# Patient Record
Sex: Female | Born: 1975 | Race: Black or African American | Hispanic: No | Marital: Married | Smoking: Former smoker
Health system: Southern US, Community
[De-identification: ages and names within clinical notes are randomized; demographics above are authoritative.]

## PROBLEM LIST (undated history)

## (undated) DIAGNOSIS — F32A Depression, unspecified: Secondary | ICD-10-CM

## (undated) DIAGNOSIS — F329 Major depressive disorder, single episode, unspecified: Secondary | ICD-10-CM

## (undated) DIAGNOSIS — D649 Anemia, unspecified: Secondary | ICD-10-CM

## (undated) DIAGNOSIS — Z789 Other specified health status: Secondary | ICD-10-CM

## (undated) DIAGNOSIS — Z8632 Personal history of gestational diabetes: Secondary | ICD-10-CM

## (undated) DIAGNOSIS — D219 Benign neoplasm of connective and other soft tissue, unspecified: Secondary | ICD-10-CM

## (undated) DIAGNOSIS — E119 Type 2 diabetes mellitus without complications: Secondary | ICD-10-CM

## (undated) HISTORY — DX: Benign neoplasm of connective and other soft tissue, unspecified: D21.9

## (undated) HISTORY — DX: Major depressive disorder, single episode, unspecified: F32.9

## (undated) HISTORY — DX: Type 2 diabetes mellitus without complications: E11.9

## (undated) HISTORY — DX: Anemia, unspecified: D64.9

## (undated) HISTORY — PX: WISDOM TOOTH EXTRACTION: SHX21

## (undated) HISTORY — DX: Personal history of gestational diabetes: Z86.32

## (undated) HISTORY — DX: Depression, unspecified: F32.A

---
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
## 1999-08-09 ENCOUNTER — Emergency Department (HOSPITAL_COMMUNITY): Admission: EM | Admit: 1999-08-09 | Discharge: 1999-08-09 | Payer: Self-pay | Admitting: Emergency Medicine

## 1999-09-08 ENCOUNTER — Other Ambulatory Visit: Admission: RE | Admit: 1999-09-08 | Discharge: 1999-09-08 | Payer: Self-pay | Admitting: *Deleted

## 2001-08-02 ENCOUNTER — Emergency Department (HOSPITAL_COMMUNITY): Admission: EM | Admit: 2001-08-02 | Discharge: 2001-08-02 | Payer: Self-pay | Admitting: Emergency Medicine

## 2005-10-05 ENCOUNTER — Emergency Department (HOSPITAL_COMMUNITY): Admission: EM | Admit: 2005-10-05 | Discharge: 2005-10-06 | Payer: Self-pay | Admitting: Emergency Medicine

## 2006-01-07 ENCOUNTER — Inpatient Hospital Stay (HOSPITAL_COMMUNITY): Admission: AD | Admit: 2006-01-07 | Discharge: 2006-01-14 | Payer: Self-pay | Admitting: *Deleted

## 2006-01-07 ENCOUNTER — Ambulatory Visit: Payer: Self-pay | Admitting: Family Medicine

## 2006-01-15 ENCOUNTER — Inpatient Hospital Stay (HOSPITAL_COMMUNITY): Admission: AD | Admit: 2006-01-15 | Discharge: 2006-01-15 | Payer: Self-pay | Admitting: Obstetrics & Gynecology

## 2006-01-15 ENCOUNTER — Ambulatory Visit: Payer: Self-pay | Admitting: Certified Nurse Midwife

## 2006-01-18 ENCOUNTER — Ambulatory Visit: Payer: Self-pay | Admitting: Gynecology

## 2006-01-23 ENCOUNTER — Encounter: Admission: RE | Admit: 2006-01-23 | Discharge: 2006-01-23 | Payer: Self-pay | Admitting: Gynecology

## 2006-01-28 ENCOUNTER — Inpatient Hospital Stay (HOSPITAL_COMMUNITY): Admission: AD | Admit: 2006-01-28 | Discharge: 2006-03-24 | Payer: Self-pay | Admitting: Obstetrics and Gynecology

## 2006-01-28 ENCOUNTER — Ambulatory Visit: Payer: Self-pay | Admitting: Family Medicine

## 2006-03-21 ENCOUNTER — Encounter (INDEPENDENT_AMBULATORY_CARE_PROVIDER_SITE_OTHER): Payer: Self-pay | Admitting: Specialist

## 2006-03-25 ENCOUNTER — Encounter: Admission: RE | Admit: 2006-03-25 | Discharge: 2006-04-04 | Payer: Self-pay | Admitting: Obstetrics and Gynecology

## 2006-04-03 ENCOUNTER — Inpatient Hospital Stay (HOSPITAL_COMMUNITY): Admission: AD | Admit: 2006-04-03 | Discharge: 2006-04-03 | Payer: Self-pay | Admitting: Obstetrics and Gynecology

## 2006-05-02 ENCOUNTER — Ambulatory Visit: Payer: Self-pay | Admitting: *Deleted

## 2009-04-13 ENCOUNTER — Ambulatory Visit: Payer: Self-pay | Admitting: Obstetrics and Gynecology

## 2009-04-13 ENCOUNTER — Encounter: Payer: Self-pay | Admitting: Obstetrics and Gynecology

## 2009-04-13 ENCOUNTER — Other Ambulatory Visit: Admission: RE | Admit: 2009-04-13 | Discharge: 2009-04-13 | Payer: Self-pay | Admitting: Obstetrics and Gynecology

## 2009-04-20 ENCOUNTER — Ambulatory Visit: Payer: Self-pay | Admitting: Gynecology

## 2009-04-20 ENCOUNTER — Encounter: Admission: RE | Admit: 2009-04-20 | Discharge: 2009-04-20 | Payer: Self-pay | Admitting: Obstetrics and Gynecology

## 2010-05-12 ENCOUNTER — Ambulatory Visit: Payer: Self-pay | Admitting: Obstetrics and Gynecology

## 2010-05-13 ENCOUNTER — Ambulatory Visit: Payer: Self-pay | Admitting: Obstetrics and Gynecology

## 2010-05-31 ENCOUNTER — Other Ambulatory Visit: Admission: RE | Admit: 2010-05-31 | Discharge: 2010-05-31 | Payer: Self-pay | Admitting: Obstetrics and Gynecology

## 2010-05-31 ENCOUNTER — Ambulatory Visit: Payer: Self-pay | Admitting: Obstetrics and Gynecology

## 2010-06-23 ENCOUNTER — Ambulatory Visit: Payer: Self-pay | Admitting: Obstetrics and Gynecology

## 2010-09-18 ENCOUNTER — Encounter: Payer: Self-pay | Admitting: *Deleted

## 2011-01-13 NOTE — Discharge Summary (Signed)
Katelyn [name], Katelyn [name]               ACCOUNT NO.:  [id number]   MEDICAL RECORD NO.:  [id number]          PATIENT TYPE:  INP   LOCATION:  WOC                           [facility]   PHYSICIAN:  Tracy L. Mayford Knife, M.D.DATE OF BIRTH:  05-19-1976   DATE OF ADMISSION:  DATE OF DISCHARGE:                                 DISCHARGE SUMMARY   ADDENDUM:  Please note the patient had a primary lower transverse cesarean  section and a myomectomy.  The patient was counseled because she had a  myomectomy that she should not have a vaginal birth.  She should always have  a repeat cesarean section.           ______________________________  Marc Morgans Mayford Knife, M.D.     TLW/MEDQ  D:  03/24/2006  T:  03/24/2006  Job:  [id number]

## 2011-01-13 NOTE — Consult Note (Signed)
[name], [name], M.D.  DATE OF BIRTH:  October 31, 1975   DATE OF CONSULTATION:  03/15/2006  DATE OF DISCHARGE:                                   CONSULTATION   REASON FOR CONSULTATION:  Depression.   HISTORY OF PRESENT ILLNESS:  Katelyn [name] is a 35 year old female  admitted on the January 28, 2006 for return of bleeding previa.   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  She has continued  with depressed mood, decreased energy, uncharacteristic crying spells,  difficulty concentrating for several days.  There have been no thoughts of  harming herself, no thoughts of harming others, no hallucinations, no  delusions.  She has declined antidepressant medication and has been engaging  in some homeopathic techniques.   On the date of consultation, her energy is still decreased.  Her mood is  improved with interests in the future and hope has returned.  She has no  thoughts of harming herself, no thoughts of harming others, no delusions, no  hallucinations.  She is able to discuss interest in a number of subjects.  The patient has utilized Ambien for insomnia with good results.   PAST PSYCHIATRIC HISTORY:  No history of major depression.  No history of  mania, no history of delusions or hallucinations.  No history of psychiatric  treatment, psychotropics or counseling.   FAMILY PSYCHIATRIC HISTORY:  None.   SOCIAL HISTORY:  Religion is [place].  Marital status is divorced and  currently engaged to be married.  This is her first pregnancy.  The father  of the baby is motivated and supportive.  He is dedicated to the future of  marriage with this patient.   The patient lives with her fiance.  Illegal drugs,  none.  Alcohol, none.  Education, the patient is working on her PhD.   GENERAL MEDICAL PROBLEMS:  Gestational diabetes and bleeding complete previa  intrauterine pregnancy at [redacted] weeks gestation.   MEDICATIONS:  The MAR is reviewed.  The patient is not on any psychotropics  except Ambien 5 to 10 mg nightly p.r.n. insomnia.   ALLERGIES:  She has no known drug allergies.   LABORATORY DATA:  No current laboratory data.   REVIEW OF SYSTEMS:  CONSTITUTIONAL:  The patient is afebrile.  HEENT:  Head:  No trauma.  Eyes:  No visual changes.  Ears:  No hearing impairment.  Nose:  No rhinorrhea.  Throat and mouth:  No sore throat.  CARDIOVASCULAR:  No  chest pain, palpitations, or edema.  RESPIRATORY:  No coughing, wheezing,  shortness of breath.  GASTROINTESTINAL:  No nausea, vomiting, or diarrhea.  GENITOURINARY:  No dysuria.  MUSCULOSKELETAL:  No deformities, weaknesses,  or atrophy.  SKIN:  Unremarkable.  NEUROLOGIC:  Unremarkable.  PSYCHIATRIC:  As above.  ENDOCRINE:  As above.  HEMATOLOGIC/LYMPHATIC:  Unremarkable.   PHYSICAL EXAMINATION:  VITAL SIGNS:  Temperature is 97.2, respirations 18,  blood pressure 122/73, pulse 106.   MENTAL STATUS EXAM:  Ms. Katelyn [name] is lying supine in her hospital bed  with excellent eye contact.  Her affect is mildly constricted at baseline,  but with a broad appropriate response.  Her mood is hopeful, and she  expresses a number of interests in the future, including desiring the baby  and being a mother.  She is alert.  She is completely oriented to all  spheres.  Her __________ knowledge and intelligence are above average.  Her  speech involves normal rate and prosody.  Thought process is logical,  coherent, goal directed.  No looseness of associations, thought content,  no  delusions, no hallucinations.  Memory is intact to immediate, recent,  remote.  Concentration is within normal limits.  Her insight is good.  Her  judgment is intact.    ASSESSMENT:  AXIS I:  293.83, mood disorder not otherwise specified (some  reactive functional symptoms as well as biological factors of pregnancy  without meeting the criteria for major depressive disorder), depressed, and  stable improvement.  AXIS II:  None.  AXIS III:  See general medical problems.  AXIS IV:  General medical.  AXIS V:  60.   The patient is not at risk to harm herself or others.  She agrees to call  emergency services immediately if she develops depression or emergency  psychiatric symptoms, including thoughts of harming herself, thoughts of  harming others, thoughts of harming the baby, or hallucinations, or severe  distress.   INFORMED CONSENT:  The indications, alternatives, and adverse effects of  Wellbutrin and Prozac were discussed with the patient for anti-depression,  including potential risks to the patient as well as unknown, but potential  risks to the baby.  The patient understands the above information, and she  wants to proceed without medication at this time since she has improved.   RECOMMENDATIONS:  1.  Concur with no medication at this point; however, recommend that the      patient be reassessed periodically during the pre- and postpartum      period.  The patient also declines counseling.  2.  While the patient does decline counseling, this can be represented to      her as well if she indicates she would like some assistance with coping      with the current changes.  3.  Recommend that the patient be seen within the first week postpartum by      outpatient psychiatry; however, if the patient declines and her primary      physician is comfortable with mood assessment, recommend that her mood      be part of the assessment during the general medical postpartum      followup.  4.  The patient does concur if depressive symptoms return or worsen, that      she would want her general medical physician reconsult psychiatry.     Antonietta Breach,  M.D.  Electronically Signed    JW/MEDQ  D:  03/15/2006  T:  03/15/2006  Job:  [id number]

## 2011-01-13 NOTE — Discharge Summary (Signed)
Katelyn [name], Katelyn [name]               ACCOUNT NO.:  [id number]   MEDICAL RECORD NO.:  [id number]          PATIENT TYPE:  INP   LOCATION:  9129                          [facility]   PHYSICIAN:  Tracy L. Mayford Knife, M.D.DATE OF BIRTH:  [birth year]   DATE OF ADMISSION:  01/28/2006  DATE OF DISCHARGE:  03/24/2006                                 DISCHARGE SUMMARY   ADMISSION DIAGNOSES:  1.  27 week 4 day intrauterine pregnancy.  2.  Complete previa with vaginal bleeding.  3.  Gestational diabetes.   DISCHARGE DIAGNOSES:  1.  Primary low transverse Cesarean section at 35 plus weeks gestational      age.  2.  Complete previa.  3.  Gestational diabetes.  4.  Mood disorder, not otherwise specified.   CONSULTATIONS:  Antonietta Breach, M.D., Psychiatry.   LABORATORY DATA:  Adjuvant hemoglobin 10.6, discharge hemoglobin 9.4.   DIAGNOSTIC STUDIES:  Multiple ultrasounds, the latest one on March 01, 2006.  Continued to show a complete previa.   HISTORY OF PRESENT ILLNESS:  The patient is a 35 year old G2, P0-0-1-0 at 62  and 4/[redacted] weeks gestational age, dated by an ultrasound and that presented  with vaginal bleeding. The patient had been admitted in May with vaginal  bleeding. Found to have a complete previa. The patient was typed and crossed  and put on bedrest. The patient stabilized but was kept in the hospital  because it was her second bleed. She did not have another episode of  bleeding until March 21, 2006, when a primary LTCS was done with a  myomectomy. Please see OP note for full details.   Other issues during the hospitalization with patient was gestational  diabetic. She initially was on Glyburide but her sugars got worse and she  had to be changed to an insulin regimen. She was agreeable to have good  control of her diabetes.   The patient had some trouble with depression during this hospitalization.  Dr. Jeanie Sewer was consulted. Please see his consult note for full details.  She  was prescribed but because of what she read on the Internet, she did not  want to start that.   Deconditioning was also a problem and the patient was also taken off  bedrest. She refused to ambulate. It got to the point where physical therapy  had to be consulted and she required a walker because of her deconditioning.   DISPOSITION:  Home in stable condition.   FOLLOWUP:  At [facility] in 6 weeks for a postpartum examination. She  also understands that she needs to be followed up at 6 weeks, or to be  screened at 6 weeks for diabetes with a normal glucose tolerance test.   DISCHARGE MEDICATIONS:  1.  Percocet 1 to 2 p.o. q.4 to 6 hours p.r.n. pain.  2.  Colace 100 mg p.o. b.i.d.  3.  Prenatal vitamins daily.  4.  Depo-Provera 150 mg IM x1, given prior to discharge.   ACTIVITY:  No heavy lifting x6 weeks. Nothing in vagina x6 weeks.   SPECIAL INSTRUCTIONS:  She is to  call if any questions or concerns.           ______________________________  Marc Morgans Mayford Knife, M.D.     TLW/MEDQ  D:  03/24/2006  T:  03/24/2006  Job:  [id number]

## 2011-01-13 NOTE — Discharge Summary (Signed)
NAMEPAIZLIE, Katelyn [name]               ACCOUNT NO.:  [id number]   MEDICAL RECORD NO.:  [id number]          PATIENT TYPE:  INP   LOCATION:  9115                          [facility]   PHYSICIAN:  Angie B. Merlene Morse, MD  DATE OF BIRTH:  1976-05-27   DATE OF ADMISSION:  01/07/2006  DATE OF DISCHARGE:  01/14/2006                                 DISCHARGE SUMMARY   ADMISSION DIAGNOSES:  1.  Intrauterine pregnancy at 24-5/7 weeks.  2.  Gestational diabetes.  3.  Complete previa.   DISCHARGE DIAGNOSES:  1.  Intrauterine pregnancy at 25-5/7 weeks.  2.  Gestational diabetes.  3.  Complete previa.   HISTORY OF PRESENT ILLNESS:  The patient is a 35 year old, G2, P0-0-1-0 at  24-5/7 weeks who is transferred from [facility] with bleeding previa  complaining of a few contractions and a good fetal activity.   REVIEW OF SYSTEMS:  History of low lying placenta, but a posterior complete  previa by ultrasound on May 12.   MEDICATIONS:  Prenatal vitamins, iron, Vitamin C.   ALLERGIES:  No known drug allergies.   PAST OBSTETRICAL HISTORY:  SAB x1.   PAST GYNECOLOGICAL HISTORY:  Questionable fibroids.   PAST MEDICAL HISTORY:  None.   PAST SURGICAL HISTORY:  None.   SOCIAL HISTORY:  Denies tobacco and ethanol.   PHYSICAL EXAMINATION:  GENERAL:  Well-developed, well-nourished female in no  acute distress.  PELVIC:  Fundus tender to palpation.  Cervix was visually closed by speculum  exam.  ABDOMEN:  Fetal heart tones were reassuring and contractions were irregular.   HOSPITAL COURSE:  The patient was admitted.  She was started on magnesium  which was weaned to Procardia as the patient stabilized.  She was treated  with penicillin until she stabilized.  She had blood sugars checked which  were elevated and she was started on insulin and these began to become  better controlled and she was switched to Glyburide 2.5 mg twice a day on  May 18.  The patient remained stable with no new bleeding  during her  hospitalization.  Her activity was slowly increased to bathroom privileges  and showers and short walks twice a day.  She had no contractions.  The  patient does live in [place] and states this is the closer hospital to  her.   PRENATAL LABORATORY DATA:  Blood type O positive, antibody screen negative.  PT 13.6, INR 1.0, PTT 29.  Fibrinogen 525.  D-dimer 0.31.  Platelets 284.  Sodium 136, potassium 3.9, chloride 108, CO2 21, glucose 116, BUN 4,  creatinine 0.5, calcium 7.4, albumin 2.5, phosphorus 2.6.   Ultrasound on May 13, showed estimated fetal weight of 724 at [facility]  percentile.  Posterior complete previa with AFI of 17.3.  Frank breech  presentation.   DISPOSITION:  The patient was discharged to home.   DISCHARGE MEDICATIONS:  1.  Glyburide 2.5 mg one p.o. twice a day.  2.  Procardia XL 30 one p.o. b.i.d.   ACTIVITY:  Bed rest with bathroom and shower privileges.  She is to be off  of work.   FOLLOW UP:  I spoke with the mid-wife who was taking care of her at [facility] and she stated that the patient can began following up with  them again at 32 weeks or if the previa resolve.  The office number there  for records is [phone].  The patient will [facility] on  Monday.  She will be called with an appointment so that she can see the  diabetic educator to manage her diabetes.           ______________________________  August Saucer Merlene Morse, MD     ABC/MEDQ  D:  01/14/2006  T:  01/15/2006  Job:  [id number]

## 2011-01-13 NOTE — Group Therapy Note (Signed)
Katelyn, [name] NO.:  [id number]   MEDICAL RECORD NO.:  [id number]          PATIENT TYPE:  WOC   LOCATION:  [facility]                   [facility]   PHYSICIAN:  Carolanne Grumbling, M.D.   DATE OF BIRTH:  27-Mar-1976   DATE OF SERVICE:  05/02/2006                                    CLINIC NOTE   35 year old G2, P0-1-1-1 here for 6 week postpartum check.  She is status  post primary low transverse cesarean section for a complete previa that bled  at 36 weeks __________ .  The surgery was complicated by her having a  fibroid right where the uterine incision was made and it required a  myomectomy.  Because of this, the patient has been counseled that she can  never have a vaginal birth again and in fact, she should have amniocentesis  to see if it is okay to deliver her early.  Since delivery, the patient  reports no pain.  She has just recently stopped spotting.  Denies any  trouble with depression.  She had gestational diabetes during the pregnancy,  has occasionally checked her blood sugars, reports the range has been 104 to  180.  The 180 was after she ate.   PHYSICAL EXAMINATION:  VITAL SIGNS:  Temperature 98.9, blood pressure  105/73, pulse 75, weight 190.3 pounds, height 5 feet 6 inches.  GENERAL:  Well-developed, well-nourished female in no apparent distress.  ABDOMEN:  Soft, tender, nondistended.  Incision clean, dry, intact.  GENITOURINARY:  Normal external genitalia.  Cervix was difficult to  visualize, only partially seen.  Pap smear was done.  Uterus anteverted,  measured approximately 10 weeks in size consistent with history of fibroids.  Adnexa free of masses.  EXTREMITIES:  No cyanosis, clubbing or edema.   IMPRESSION:  32. 35 year old G2, P0-1-1-1 status post cesarean section for complete      previa with myomectomy.  2. Gestational diabetes.   PLAN:  Pap done today.  Two-hour glucose tolerance test ordered.  Patient  counseled that if it  is abnormal, she should establish herself with a family  medicine doctor to care for her diabetes.  I have counseled on weight loss.  She gained 75 pounds during the pregnancy and has lost 20 to 30 of those  pounds.  I have encouraged her to keep up the good work.  Encouraged her to  continue breast feeding.  Patient desires family planning which she has a  certified person to teach her how to do that.           ______________________________  Carolanne Grumbling, M.D.     TW/MEDQ  D:  05/02/2006  T:  05/03/2006  Job:  [id number]

## 2011-01-13 NOTE — Op Note (Signed)
NAMEMYRIA, STEENBERGEN NO.:  [id number]   MEDICAL RECORD NO.:  [id number]          PATIENT TYPE:  INP   LOCATION:                                [facility]   PHYSICIAN:  Lesly Dukes, M.D. DATE OF BIRTH:  April 30, 1976   DATE OF PROCEDURE:  DATE OF DISCHARGE:  03/24/2006                                 OPERATIVE REPORT   PREOPERATIVE DIAGNOSIS:  A 35 year old female at 35+ weeks estimated  gestational age with bleeding previa.   POSTOPERATIVE DIAGNOSES:  65. A 35 year old female at 35+ weeks estimated gestational age with      bleeding previa.  2. Intrauterine fibroid in incision line.   PROCEDURE:  Low transverse cesarean section and myomectomy.   SURGEON:  Dr. Elsie Lincoln.   ASSISTANT:  Geraldo Docker.   ANESTHESIA:  Spinal.   PATHOLOGY:  Placenta.   ESTIMATED BLOOD LOSS:  1000 mL.   COMPLICATIONS:  None.   FINDINGS:  Viable female infant with Apgars 9 and 9.  Vertex.  Clear fluid.  Nuchal cord x1.  Multiple fibroids in the uterus with a large central  fibroid obliterating lower uterine segment that was at the incision and had  to go through the anterior fibroid.  Uterus was so large that ovaries could  only be palpated and not visualized.   PROCEDURE:  After informed consent was obtained, the patient was taken to  the operating room.  Spinal anesthesia was noted to be adequate.  The  patient was placed in the dorsal lithotomy position and prepared and draped  in normal sterile fashion.  Foley was placed in the bladder.  A Pfannenstiel  skin incision was made and carried down the fascia.  The fascia was incised  in the midline and extended bilaterally.  The superior and inferior aspect  of the fascial incision were grasped with Kocher clamps, tented up, and  dissected of sharply and bluntly from the underlying layer of rectus  muscles.  The rectus muscles were separated in the midline.  The peritoneum  was entered bluntly and extended both  superiorly and inferiorly with good  visualization of the bladder.  The bladder blade was inserted.  The  vesicouterine peritoneum was identified, tented up, and entered sharply with  Metzenbaum scissors.  This incision was extended bilaterally.  The bladder  flap was created digitally.  The bladder blade was reinserted.  The uterus  was noted to be extremely myomatous and the lower uterine segment was  completely obliterated by the anterior fibroid.  The incison was in the  lower uterin segnment in a transverse fashion.  We had to go through the  lower aspect of the fibroid in order to get to the infant.  The infant's  head delivered atraumatically.  The nose and mouth were suctioned.  The rest  of infant's body delivered easily.  The cord was clamped and cut and baby  was handed off to the waiting pediatrician.  The placenta delivered manually  and sent to Pathology.  The uterus was cleared of all clots and debris.  The  cervix was manually dilated with the sponge stick.  A myomectomy was then  performed and the myoma was sent off to Pathology.  The area where the  fibroid was, was then over sewn and found to be hemostatic.  The rest of  incision was then closed with 0 Vicryl in running lock fashion.  A second  stitch of 0 Monocryl was then used to reinforce the incision and aid in  hemostasis.  One last look at the incision noted good hemostasis.  Rectus  muscles and peritoneum were noted to be hemostatic.  The fascia was closed  with 0 Vicryl in running fashion.  Good hemostasis was noted.  The  subcutaneous tissues were copiously irrigated and the skin was closed with  staples.  The patient tolerated the procedure well.  Sponge, lap, instrument  and needle count correct x2.  The patient was sent to recovery room in good  condition.           ______________________________  Lesly Dukes, M.D.     KHL/MEDQ  D:  03/21/2006  T:  03/21/2006  Job:  [id number]

## 2012-04-24 ENCOUNTER — Encounter: Payer: Self-pay | Admitting: Women's Health

## 2012-04-24 ENCOUNTER — Ambulatory Visit (INDEPENDENT_AMBULATORY_CARE_PROVIDER_SITE_OTHER): Payer: BC Managed Care – PPO | Admitting: Women's Health

## 2012-04-24 DIAGNOSIS — R319 Hematuria, unspecified: Secondary | ICD-10-CM

## 2012-04-24 DIAGNOSIS — N390 Urinary tract infection, site not specified: Secondary | ICD-10-CM

## 2012-04-24 LAB — URINALYSIS W MICROSCOPIC + REFLEX CULTURE
Glucose, UA: NEGATIVE mg/dL
Nitrite: NEGATIVE
Protein, ur: 30 mg/dL — AB
Urobilinogen, UA: 0.2 mg/dL (ref 0.0–1.0)

## 2012-04-24 MED ORDER — NITROFURANTOIN MONOHYD MACRO 100 MG PO CAPS: 100.0000 mg | ORAL_CAPSULE | Freq: Two times a day (BID) | ORAL | Status: AC

## 2012-04-24 NOTE — Progress Notes (Signed)
Patient ID: Katelyn [name], female   DOB: 1976/06/09, 36 y.o.   MRN: [id number] Presents with the complaint of low back ache, pressure with urination, blood in urine and increased frequency for 2 days. Denies a fever or any vaginal discharge.  Exam: UA: WBCs 20-50, many bacteria, large leukocytes, moderate blood with 11-20 rbc's. No CVAT.  UTI  Plan: Macrobid 1 by mouth twice a day for 7 days take with food. Instructed to call if no relief of symptoms, UTI prevention discussed.

## 2012-04-24 NOTE — Patient Instructions (Addendum)

## 2012-04-26 LAB — URINE CULTURE: Colony Count: 10000

## 2012-05-08 ENCOUNTER — Encounter: Payer: BC Managed Care – PPO | Admitting: Obstetrics and Gynecology

## 2012-05-15 ENCOUNTER — Other Ambulatory Visit (HOSPITAL_COMMUNITY)
Admission: RE | Admit: 2012-05-15 | Discharge: 2012-05-15 | Disposition: A | Discharge status: Home / Self Care | Payer: BC Managed Care – PPO | Source: Ambulatory Visit | Attending: Obstetrics and Gynecology | Admitting: Obstetrics and Gynecology

## 2012-05-15 ENCOUNTER — Encounter: Payer: Self-pay | Admitting: Obstetrics and Gynecology

## 2012-05-15 ENCOUNTER — Ambulatory Visit (INDEPENDENT_AMBULATORY_CARE_PROVIDER_SITE_OTHER): Payer: BC Managed Care – PPO | Admitting: Obstetrics and Gynecology

## 2012-05-15 VITALS — BP 118/72 | Ht 64.5 in | Wt 172.0 lb

## 2012-05-15 DIAGNOSIS — Z1151 Encounter for screening for human papillomavirus (HPV): Secondary | ICD-10-CM | POA: Insufficient documentation

## 2012-05-15 DIAGNOSIS — N946 Dysmenorrhea, unspecified: Secondary | ICD-10-CM

## 2012-05-15 DIAGNOSIS — D219 Benign neoplasm of connective and other soft tissue, unspecified: Secondary | ICD-10-CM

## 2012-05-15 DIAGNOSIS — D259 Leiomyoma of uterus, unspecified: Secondary | ICD-10-CM

## 2012-05-15 DIAGNOSIS — Z01419 Encounter for gynecological examination (general) (routine) without abnormal findings: Secondary | ICD-10-CM | POA: Insufficient documentation

## 2012-05-15 DIAGNOSIS — Z833 Family history of diabetes mellitus: Secondary | ICD-10-CM

## 2012-05-15 LAB — CBC WITH DIFFERENTIAL/PLATELET
Basophils Absolute: 0 10*3/uL (ref 0.0–0.1)
Basophils Relative: 0 % (ref 0–1)
Eosinophils Absolute: 0.2 10*3/uL (ref 0.0–0.7)
Hemoglobin: 10.2 g/dL — ABNORMAL LOW (ref 12.0–15.0)
MCH: 25.4 pg — ABNORMAL LOW (ref 26.0–34.0)
MCHC: 33 g/dL (ref 30.0–36.0)
Monocytes Absolute: 0.7 10*3/uL (ref 0.1–1.0)
Monocytes Relative: 8 % (ref 3–12)
Neutrophils Relative %: 64 % (ref 43–77)
RDW: 17.1 % — ABNORMAL HIGH (ref 11.5–15.5)

## 2012-05-15 LAB — URINALYSIS W MICROSCOPIC + REFLEX CULTURE
Glucose, UA: NEGATIVE mg/dL
Hgb urine dipstick: NEGATIVE
Leukocytes, UA: NEGATIVE
Protein, ur: NEGATIVE mg/dL
Urobilinogen, UA: 0.2 mg/dL (ref 0.0–1.0)

## 2012-05-15 LAB — HEMOGLOBIN A1C: Hgb A1c MFr Bld: 5.9 % — ABNORMAL HIGH (ref <5.7)

## 2012-05-15 LAB — HDL CHOLESTEROL: HDL: 49 mg/dL (ref >39)

## 2012-05-15 NOTE — Progress Notes (Signed)
Patient came to see me today for an annual GYN exam. Her periods remain miserable with fibroids, dysmenorrhea, a lower back pain, menorrhagia and fatigue. She had a previous ultrasound 2 years ago which showed a possible submucous myoma. We have discussed hysteroscopy but she did not follow through. She also had a Pap smear them showing atypical endocervical glandular cells. Colposcopy and biopsy were normal. She has had no further Pap smears. She contracepts  with condoms. She does not want to have more children after a very bad pregnancy with placenta previa. She does not want to proceed with myomectomy or hysterectomy and wanted other options. When she had her ultrasound previously she also had a large cyst on her left ovary.  Physical examination:Katelyn [name] present. HEENT within normal limits. Neck: Thyroid not large. No masses. Supraclavicular nodes: not enlarged. Breasts: Examined in both sitting and lying  position. No skin changes and no masses. Abdomen: Soft no guarding rebound or masses or hernia. Pelvic: External: Within normal limits. BUS: Within normal limits. Vaginal:within normal limits. Good estrogen effect. No evidence of cystocele rectocele or enterocele. Cervix: clean. Uterus 9-10 weeks size from fibroids. Adnexa: No masses. Rectovaginal exam: Confirmatory and negative. Extremities: Within normal limits.  Assessment: #1. Symptomatic fibroids #2. Left ovarian cyst #3. Atypical endocervical glandular cells.  Plan: Pap and HPV typing done. Discussed hysteroscopy, myomectomy, hysterectomy, uterine artery embolization, Depo-Lupron, Mirena IUD. Information given. [facility] scheduled.

## 2012-05-15 NOTE — Patient Instructions (Signed)
Schedule saline infusion histogram. 

## 2012-05-15 NOTE — Addendum Note (Signed)
Addended by: Richardson Chiquito on: 05/15/2012 03:44 PM   Modules accepted: Orders

## 2012-06-11 ENCOUNTER — Other Ambulatory Visit: Payer: Self-pay | Admitting: Obstetrics and Gynecology

## 2012-06-11 ENCOUNTER — Ambulatory Visit (INDEPENDENT_AMBULATORY_CARE_PROVIDER_SITE_OTHER): Payer: BC Managed Care – PPO

## 2012-06-11 ENCOUNTER — Ambulatory Visit (INDEPENDENT_AMBULATORY_CARE_PROVIDER_SITE_OTHER): Payer: BC Managed Care – PPO | Admitting: Obstetrics and Gynecology

## 2012-06-11 DIAGNOSIS — D259 Leiomyoma of uterus, unspecified: Secondary | ICD-10-CM

## 2012-06-11 DIAGNOSIS — N8300 Follicular cyst of ovary, unspecified side: Secondary | ICD-10-CM

## 2012-06-11 DIAGNOSIS — D251 Intramural leiomyoma of uterus: Secondary | ICD-10-CM

## 2012-06-11 DIAGNOSIS — N852 Hypertrophy of uterus: Secondary | ICD-10-CM

## 2012-06-11 DIAGNOSIS — N938 Other specified abnormal uterine and vaginal bleeding: Secondary | ICD-10-CM

## 2012-06-11 DIAGNOSIS — D219 Benign neoplasm of connective and other soft tissue, unspecified: Secondary | ICD-10-CM

## 2012-06-11 DIAGNOSIS — N946 Dysmenorrhea, unspecified: Secondary | ICD-10-CM

## 2012-06-11 DIAGNOSIS — N949 Unspecified condition associated with female genital organs and menstrual cycle: Secondary | ICD-10-CM

## 2012-06-11 DIAGNOSIS — N920 Excessive and frequent menstruation with regular cycle: Secondary | ICD-10-CM

## 2012-06-11 DIAGNOSIS — N840 Polyp of corpus uteri: Secondary | ICD-10-CM

## 2012-06-11 DIAGNOSIS — D250 Submucous leiomyoma of uterus: Secondary | ICD-10-CM

## 2012-06-11 NOTE — Progress Notes (Signed)
Patient came back today for saline infusion histogram due to menorrhagia. On ultrasound her uterus is enlarged by multiple fibroids. The largest is 6.5 cm x 3.8 cm. At least 8 fibroids can be identified. It appears to be submucosal in its largest dimension is 4 cm. Patient's ovaries are both normal. Her endometrial echo was enlarged at 23 mm. There is no cul-de-sac fluid. A catheter was placed in the patient's uterus. Saline was introduced. There was an extremely large submucous myoma and fills most of the cavity. In addition there are 2 small endometrial polyps. An endometrial biopsy was obtained. Please note patient does not want a hysterectomy as  she would like to conserve her uterus for childbearing.  Assessment: Multiple myoma including submucous myoma. Endometrial polyps. Menorrhagia with anemia.  Plan: We have a 20 minute discussion of options. We discussed hysterectomy, myomectomy or hysteroscopic myomectomy. Patient is well aware that with hysteroscopic myomectomy all fibroids remain except for submucous one and the polyps. However we discussed that that might significantly reduce her menorrhagia. I believe that she would need preoperative Depo-Lupron to shrink the fibroid enough for hysteroscopic surgery. She was given booklets on hysteroscopy and Depo-Lupron. We also discussed abdominal myomectomy to remove all fibroids. I introduced her to Dr. Lily Peer since I believe by the time the Depo-Lupron worked I will be retired and I recommend that he do her surgery. She will stay on her iron. She will think about her options. We will call her we get her approved for Depo-Lupron. We discussed add back therapy with estrogen to prevent menopausal symptoms.

## 2012-06-11 NOTE — Patient Instructions (Signed)
We will call you when we get you approved for Depo-Lupron.

## 2012-07-01 ENCOUNTER — Telehealth: Payer: Self-pay | Admitting: Obstetrics and Gynecology

## 2012-07-01 ENCOUNTER — Encounter: Payer: Self-pay | Admitting: Obstetrics and Gynecology

## 2012-07-01 NOTE — Telephone Encounter (Signed)
I called [facility] pharmacy today to see why patient's Lupron has not shipped to us and we faxed RX on 06/17/12.  They have been trying to reach patient to get her to authorize them shipping it to us.  The rep put me on hold and tried again.  Patient's phone recording says her voice mail box is full and you cannot leave a message.  They have tried numerous times.  The rep said that next week the med goes back and we will have to start this process all over again if we want her to get Lupron.  I mailed patient a letter asking her to call [facility] asap so that we can avoid having to repeat the process and she can move forward with her treatment plan. 

## 2012-07-22 ENCOUNTER — Other Ambulatory Visit: Payer: Self-pay | Admitting: *Deleted

## 2012-07-22 DIAGNOSIS — D649 Anemia, unspecified: Secondary | ICD-10-CM

## 2012-08-06 ENCOUNTER — Telehealth: Payer: Self-pay | Admitting: Gynecology

## 2012-08-06 NOTE — Telephone Encounter (Signed)
Please ask her what alternative treatment she will try.

## 2012-08-06 NOTE — Telephone Encounter (Signed)
Left messsage patient to call.

## 2012-08-06 NOTE — Telephone Encounter (Signed)
Patient called today in response to a letter I sent her on 07/01/12 because I could not get patient to respond to [facility]'s (pharmacy) request to contact them so that they could ship her Lupron to [place].  Patient called today in my voice mail and said "I have plans for alternative treatment at this time and do not want to order Lupron".  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

## 2012-08-26 ENCOUNTER — Telehealth: Payer: Self-pay | Admitting: Obstetrics and Gynecology

## 2012-08-26 NOTE — Telephone Encounter (Signed)
I left another message for patient to call.  I explained to her that tomorrow is Dr. Timoteo Expose last day before he retires and he is anxious to hear from her to know what her plan is for treatment.  I told her I was calling her at his request and to please call me back and let me know.

## 2013-01-27 ENCOUNTER — Telehealth: Payer: Self-pay | Admitting: *Deleted

## 2013-01-27 MED ORDER — ALPRAZOLAM 0.25 MG PO TABS: 0.2500 mg | ORAL_TABLET | Freq: Four times a day (QID) | ORAL | Status: DC

## 2013-01-27 NOTE — Telephone Encounter (Signed)
Pt lost her 39 year old daughter this morning, very unexpectedly. She is requesting a medication to help her get through the next week or so. Pls Advise

## 2013-01-27 NOTE — Telephone Encounter (Signed)
Xanax 0.25 #30 one by mouth every 6 hour when necessary anxiety refill x1

## 2013-01-27 NOTE — Telephone Encounter (Signed)
Informed and rx called in [facility]

## 2013-06-30 ENCOUNTER — Encounter: Payer: Self-pay | Admitting: Gynecology

## 2013-06-30 ENCOUNTER — Ambulatory Visit (INDEPENDENT_AMBULATORY_CARE_PROVIDER_SITE_OTHER): Payer: BC Managed Care – PPO | Admitting: Gynecology

## 2013-06-30 ENCOUNTER — Other Ambulatory Visit (HOSPITAL_COMMUNITY)
Admission: RE | Admit: 2013-06-30 | Discharge: 2013-06-30 | Disposition: A | Discharge status: Home / Self Care | Payer: BC Managed Care – PPO | Source: Ambulatory Visit | Attending: Gynecology | Admitting: Gynecology

## 2013-06-30 VITALS — BP 120/86 | Ht 64.25 in | Wt 183.0 lb

## 2013-06-30 DIAGNOSIS — N920 Excessive and frequent menstruation with regular cycle: Secondary | ICD-10-CM | POA: Insufficient documentation

## 2013-06-30 DIAGNOSIS — N949 Unspecified condition associated with female genital organs and menstrual cycle: Secondary | ICD-10-CM

## 2013-06-30 DIAGNOSIS — D649 Anemia, unspecified: Secondary | ICD-10-CM | POA: Insufficient documentation

## 2013-06-30 DIAGNOSIS — D259 Leiomyoma of uterus, unspecified: Secondary | ICD-10-CM

## 2013-06-30 DIAGNOSIS — Z01419 Encounter for gynecological examination (general) (routine) without abnormal findings: Secondary | ICD-10-CM | POA: Insufficient documentation

## 2013-06-30 DIAGNOSIS — N938 Other specified abnormal uterine and vaginal bleeding: Secondary | ICD-10-CM | POA: Insufficient documentation

## 2013-06-30 DIAGNOSIS — F329 Major depressive disorder, single episode, unspecified: Secondary | ICD-10-CM

## 2013-06-30 LAB — CBC WITH DIFFERENTIAL/PLATELET
Basophils Relative: 0 % (ref 0–1)
Eosinophils Absolute: 0.4 10*3/uL (ref 0.0–0.7)
Eosinophils Relative: 5 % (ref 0–5)
Hemoglobin: 9.4 g/dL — ABNORMAL LOW (ref 12.0–15.0)
Lymphs Abs: 2 10*3/uL (ref 0.7–4.0)
MCH: 23.6 pg — ABNORMAL LOW (ref 26.0–34.0)
MCHC: 31.8 g/dL (ref 30.0–36.0)
MCV: 74.2 fL — ABNORMAL LOW (ref 78.0–100.0)
Monocytes Relative: 8 % (ref 3–12)
RBC: 3.99 MIL/uL (ref 3.87–5.11)

## 2013-06-30 LAB — PREGNANCY, URINE: Preg Test, Ur: NEGATIVE

## 2013-06-30 MED ORDER — SERTRALINE HCL 50 MG PO TABS: 50.0000 mg | ORAL_TABLET | Freq: Every day | ORAL | Status: DC

## 2013-06-30 MED ORDER — OXYCODONE-ACETAMINOPHEN 5-500 MG PO CAPS: 1.0000 | ORAL_CAPSULE | ORAL | Status: DC | PRN

## 2013-06-30 NOTE — Patient Instructions (Addendum)
Transvaginal Ultrasound Transvaginal ultrasound is a pelvic ultrasound, using a metal probe that is placed in the vagina, to look at a women's female organs. Transvaginal ultrasound is a method of seeing inside the pelvis of a woman. The ultrasound machine sends out sound waves from the transducer (probe). These sound waves bounce off body structures (like an echo) to create a picture. The picture shows up on a monitor. It is called transvaginal because the probe is inserted into the vagina. There should be very little discomfort from the vaginal probe. This test can also be used during pregnancy. Endovaginal ultrasound is another name for a transvaginal ultrasound. In a transabdominal ultrasound, the probe is placed on the outside of the belly. This method gives pictures that are lower quality than pictures from the transvaginal technique. Transvaginal ultrasound is used to look for problems of the female genital tract. Some such problems include:  Infertility problems.  Congenital (birth defect) malformations of the uterus and ovaries.  Tumors in the uterus.  Abnormal bleeding.  Ovarian tumors and cysts.  Abscess (inflamed tissue around pus) in the pelvis.  Unexplained abdominal or pelvic pain.  Pelvic infection. DURING PREGNANCY, TRANSVAGINAL ULTRASOUND MAY BE USED TO LOOK AT:  Normal pregnancy.  Ectopic pregnancy (pregnancy outside the uterus).  Fetal heartbeat.  Abnormalities in the pelvis, that are not seen well with transabdominal ultrasound.  Suspected twins or multiples.  Impending miscarriage.  Problems with the cervix (incompetent cervix, not able to stay closed and hold the baby).  When doing an amniocentesis (removing fluid from the pregnancy sac, for testing).  Looking for abnormalities of the baby.  Checking the growth, development, and age of the fetus.  Measuring the amount of fluid in the amniotic sac.  When doing an external version of the baby (moving  baby into correct position).  Evaluating the baby for problems in high risk pregnancies (biophysical profile).  Suspected fetal demise (death). Sometimes a special ultrasound method called Saline Infusion Sonography (SIS) is used for a more accurate look at the uterus. Sterile saline (salt water) is injected into the uterus of non-pregnant patients to see the inside of the uterus better. SIS is not used on pregnant women. The vaginal probe can also assist in obtaining biopsies of abnormal areas, in draining fluid from cysts on the ovary, and in finding IUDs (intrauterine device, birth control) that cannot be located. PREPARATION FOR TEST A transvaginal ultrasound is done with the bladder empty. The transabdominal ultrasound is done with your bladder full. You may be asked to drink several glasses of water before that exam. Sometimes, a transabdominal ultrasound is done just after a transvaginal ultrasound, to look at organs in your abdomen. PROCEDURE  You will lie down on a table, with your knees bent and your feet in foot holders. The probe is covered with a condom. A sterile lubricant is put into the vagina and on the probe. The lubricant helps transmit the sound waves and avoid irritating the vagina. Your caregiver will move the probe inside the vaginal cavity to scan the pelvic structures. A normal test will show a normal pelvis and normal contents. An abnormal test will show abnormalities of the pelvis, placenta, or baby. ABNORMAL RESULTS MAY BE DUE TO:  Growths or tumors in the:  Uterus.  Ovaries.  Vagina.  Other pelvic structures.  Non-cancerous growths of the uterus and ovaries.  Twisting of the ovary, cutting off blood supply to the ovary (ovarian torsion).  Areas of infection, including:  Pelvic  inflammatory disease.  Abscess in the pelvis.  Locating an IUD. PROBLEMS FOUND IN PREGNANT WOMEN MAY INCLUDE:  Ectopic pregnancy (pregnancy outside the uterus).  Multiple  pregnancies.  Early dilation (opening) of the cervix. This may indicate an incompetent cervix and early delivery.  Impending miscarriage.  Fetal death.  Problems with the placenta, including:  Placenta has grown over the opening of the womb (placenta previa).  Placenta has separated early in the womb (placental abruption).  Placenta grows into the muscle of the uterus (placenta accreta).  Tumors of pregnancy, including gestational trophoblastic disease. This is an abnormal pregnancy, with no fetus. The uterus is filled with many grape-like cysts that could sometimes be cancerous.  Incorrect position of the fetus (breech, vertex).  Intrauterine fetal growth retardation (IUGR) (poor growth in the womb).  Fetal abnormalities or infection. RISKS AND COMPLICATIONS There are no known risks to the ultrasound procedure. There is no X-ray used when doing an ultrasound. Document Released: 07/26/2004 Document Revised: 11/06/2011 Document Reviewed: 07/14/2009 [facility] Patient Information 2014 [place], [place]. Sertraline tablets What is this medicine? SERTRALINE (SER tra leen) is used to treat depression. It may also be used to treat obsessive compulsive disorder, panic disorder, post-trauma stress, premenstrual dysphoric disorder (PMDD) or social anxiety. This medicine may be used for other purposes; ask your health care provider or pharmacist if you have questions. What should I tell my health care provider before I take this medicine? They need to know if you have any of these conditions: -bipolar disorder or a family history of bipolar disorder -diabetes -heart disease -liver disease -receiving electroconvulsive therapy -seizures (convulsions) -suicidal thoughts, plans, or attempt by you or a family member -an unusual or allergic reaction to sertraline, other medicines, foods, dyes, or preservatives -pregnant or trying to get pregnant -breast-feeding How should I use this  medicine? Take this medicine by mouth with a glass of water. Follow the directions on the prescription label. You can take it with or without food. Take your medicine at regular intervals. Do not take your medicine more often than directed. Do not stop taking this medicine suddenly except upon the advice of your doctor. Stopping this medicine too quickly may cause serious side effects or your condition may worsen. A special MedGuide will be given to you by the pharmacist with each prescription and refill. Be sure to read this information carefully each time. Talk to your pediatrician regarding the use of this medicine in children. While this drug may be prescribed for children as young as 7 years for selected conditions, precautions do apply. Overdosage: If you think you have taken too much of this medicine contact a poison control center or emergency room at once. NOTE: This medicine is only for you. Do not share this medicine with others. What if I miss a dose? If you miss a dose, take it as soon as you can. If it is almost time for your next dose, take only that dose. Do not take double or extra doses. What may interact with this medicine? Do not take this medicine with any of the following medications: -linezolid -MAOIs like Carbex, Eldepryl, Marplan, Nardil, and Parnate -methylene blue (injected into a vein) -pimozide -thioridazine This medicine may also interact with the following medications: -alcohol -aspirin and aspirin-like medicines -certain medicines for depression, anxiety, or psychotic disturbances -certain medicines for migraine headaches like almotriptan, eletriptan, frovatriptan, naratriptan, rizatriptan, sumatriptan, zolmitriptan -certain medicines for seizures like carbamazepine, valproic acid, phenytoin -cimetidine -digoxin -diuretics -fentanyl -furazolidone -isoniazid -lithium -medicines  that treat or prevent blood clots like warfarin, enoxaparin, and  dalteparin -medicines to control heart rhythm like flecainide or propafenone -medicines for sleep -NSAIDs, medicines for pain and inflammation, like ibuprofen or naproxen -procarbazine -rasagiline -supplements like St. John's wort, kava kava, valerian -tolbutamide -tramadol -tryptophan This list may not describe all possible interactions. Give your health care provider a list of all the medicines, herbs, non-prescription drugs, or dietary supplements you use. Also tell them if you smoke, drink alcohol, or use illegal drugs. Some items may interact with your medicine. What should I watch for while using this medicine? Tell your doctor if your symptoms do not get better or if they get worse. Visit your doctor or health care professional for regular checks on your progress. Because it may take several weeks to see the full effects of this medicine, it is important to continue your treatment as prescribed by your doctor. Patients and their families should watch out for new or worsening thoughts of suicide or depression. Also watch out for sudden changes in feelings such as feeling anxious, agitated, panicky, irritable, hostile, aggressive, impulsive, severely restless, overly excited and hyperactive, or not being able to sleep. If this happens, especially at the beginning of treatment or after a change in dose, call your health care professional. Bonita Quin may get drowsy or dizzy. Do not drive, use machinery, or do anything that needs mental alertness until you know how this medicine affects you. Do not stand or sit up quickly, especially if you are an older patient. This reduces the risk of dizzy or fainting spells. Alcohol may interfere with the effect of this medicine. Avoid alcoholic drinks. Your mouth may get dry. Chewing sugarless gum or sucking hard candy, and drinking plenty of water may help. Contact your doctor if the problem does not go away or is severe. What side effects may I notice from  receiving this medicine? Side effects that you should report to your doctor or health care professional as soon as possible: -allergic reactions like skin rash, itching or hives, swelling of the face, lips, or tongue -black or bloody stools, blood in the urine or vomit -fast, irregular heartbeat -feeling faint or lightheaded, falls -hallucination, loss of contact with reality -seizures -suicidal thoughts or other mood changes -unusual bleeding or bruising -unusually weak or tired -vomiting Side effects that usually do not require medical attention (report to your doctor or health care professional if they continue or are bothersome): -change in appetite -change in sex drive or performance -diarrhea -increased sweating -indigestion, nausea -tremors This list may not describe all possible side effects. Call your doctor for medical advice about side effects. You may report side effects to FDA at 1-800-FDA-1088. Where should I keep my medicine? Keep out of the reach of children. Store at room temperature between 15 and 30 degrees C (59 and 86 degrees F). Throw away any unused medicine after the expiration date. NOTE: This sheet is a summary. It may not cover all possible information. If you have questions about this medicine, talk to your doctor, pharmacist, or health care provider.  2013, [employer]. (12/29/2011 8:59:59 PM)

## 2013-07-01 ENCOUNTER — Encounter: Payer: Self-pay | Admitting: Obstetrics and Gynecology

## 2013-07-01 LAB — URINALYSIS W MICROSCOPIC + REFLEX CULTURE
Bacteria, UA: NONE SEEN
Bilirubin Urine: NEGATIVE
Casts: NONE SEEN
Crystals: NONE SEEN
Glucose, UA: NEGATIVE mg/dL
Hgb urine dipstick: NEGATIVE
Ketones, ur: NEGATIVE mg/dL
Nitrite: NEGATIVE
Specific Gravity, Urine: 1.016 (ref 1.005–1.030)
pH: 8 (ref 5.0–8.0)

## 2013-07-01 LAB — COMPREHENSIVE METABOLIC PANEL
BUN: 11 mg/dL (ref 6–23)
CO2: 23 mEq/L (ref 19–32)
Creat: 0.82 mg/dL (ref 0.50–1.10)
Glucose, Bld: 87 mg/dL (ref 70–99)
Total Bilirubin: 0.4 mg/dL (ref 0.3–1.2)

## 2013-07-01 LAB — CHOLESTEROL, TOTAL: Cholesterol: 194 mg/dL (ref 0–200)

## 2013-07-01 LAB — TSH: TSH: 1.039 u[IU]/mL (ref 0.350–4.500)

## 2013-07-01 NOTE — Progress Notes (Signed)
Katelyn [name] 24-Aug-1976 [id number]   History:    37 y.o.  for annual gyn exam complaining of intermenstrual spotting. Review of patient's record indicated she was seen in October 2013 by my partner to discuss the previous saline infusion histogram because at that time patient was experiencing heavy cycles. The ultrasound demonstrated a large uterus with multiple fibroids. The largest one measures 6.5 x 3.8 cm. Approximately 8 fibroids have been identified. There appeared to be one submucosal with the description of having a dimension of 4 cm. Patient's ovaries were reported to be normal. Endometrial echo was enlarged at 23 mm. The sono infusion histogram confirmed a large submucous myoma which was described as filling the endometrial cavity with the addition of 2 small endometrial polyps. An endometrial biopsy was done at that time with the following results:  Endometrium, biopsy - SCANT GLANDULAR FRAGMENTS ADMIXED WITH ABUNDANT BLOOD AND MUCUS. - SEE MICROSCOPIC DESCRIPTION. Microscopic Comment The specimen consists mostly of blood and mucus with scant admixed glandular cells most of which have features consistent with endocervical origin. There is a rare fragment which may represent endometrium with papillary syncytial metaplasia. This fragment is minute which hampers definitive evaluation. The amount of glandular tissue present is limited.  The plan was to place the patient at that time on Lupron in an effort to shrink the submucous myoma to be able to safely proceed with a resectoscopic myomectomy but patient did not followup.  Patient deformity that back in 1993 when she was much younger [place] she had a laparotomy for removal of a large ovarian cyst which she does not recall to what the right or left but she described it as being benign.  The patient has had history in the past of anemia as a result of her heavy cycles. Patient's 78-year-old child died early this year and patient  has been seeking help with therapy as but is on no medication and is depressed.  Past medical history,surgical history, family history and social history were all reviewed and documented in the EPIC chart.  Gynecologic History Patient's last menstrual period was 06/09/2013. Contraception: condoms Last Pap: 2013. Results were: normal Last mammogram: none indicated. Results were: none indicated  Obstetric History OB History  Gravida Para Term Preterm AB SAB TAB Ectopic Multiple Living  1 1        1     # Outcome Date GA Lbr Len/2nd Weight Sex Delivery Anes PTL Lv  1 PAR                ROS: A ROS was performed and pertinent positives and negatives are included in the history.  GENERAL: No fevers or chills. HEENT: No change in vision, no earache, sore throat or sinus congestion. NECK: No pain or stiffness. CARDIOVASCULAR: No chest pain or pressure. No palpitations. PULMONARY: No shortness of breath, cough or wheeze. GASTROINTESTINAL: No abdominal pain, nausea, vomiting or diarrhea, melena or bright red blood per rectum. GENITOURINARY: No urinary frequency, urgency, hesitancy or dysuria. MUSCULOSKELETAL: No joint or muscle pain, no back pain, no recent trauma. DERMATOLOGIC: No rash, no itching, no lesions. ENDOCRINE: No polyuria, polydipsia, no heat or cold intolerance. No recent change in weight. HEMATOLOGICAL: No anemia or easy bruising or bleeding. NEUROLOGIC: No headache, seizures, numbness, tingling or weakness. PSYCHIATRIC: No depression, no loss of interest in normal activity or change in sleep pattern.     Exam: chaperone present  BP 120/86  Ht 5' 4.25" (1.632 m)  Wt 183 lb (  83.008 kg)  BMI 31.17 kg/m2  LMP 06/09/2013  Body mass index is 31.17 kg/(m^2).  General appearance : Well developed well nourished female. No acute distress HEENT: Neck supple, trachea midline, no carotid bruits, no thyroidmegaly Lungs: Clear to auscultation, no rhonchi or wheezes, or rib retractions   Heart: Regular rate and rhythm, no murmurs or gallops Breast:Examined in sitting and supine position were symmetrical in appearance, no palpable masses or tenderness,  no skin retraction, no nipple inversion, no nipple discharge, no skin discoloration, no axillary or supraclavicular lymphadenopathy Abdomen: no palpable masses or tenderness, no rebound or guarding Extremities: no edema or skin discoloration or tenderness Spotting between cycles and having heavy periods Pelvic:  Bartholin, Urethra, Skene Glands: Within normal limits             Vagina: No gross lesions or discharge  Cervix: No gross lesions or discharge  Uterus  Approximately 14 weeks' size and irregular  Adnexa  Without masses or tenderness  Anus and perineum  normal   Rectovaginal  normal sphincter tone without palpated masses or tenderness             Hemoccult none indicated   A urine pregnancy test was obtained which was negative. The patient was then counseled for endometrial biopsy as part of her evaluation for dysfunctional uterine bleeding. The cervix was cleaned with Betadine solution. A sterile Pipelle was introduced. The uterine cavity measured 7 cm. Tissue was submitted for histological evaluation.  Assessment/Plan:  37 y.o. female for annual exam with history of leiomyomatous uteri who is suffering from menorrhagia and intermenstrual bleeding. Her uterus appears to be 14-15 weeks' size and irregular. Her going to wait for the results of the endometrial biopsy. Patient will schedule for sonohysterogram the next week to better assess the intrauterine cavity. We discussed the previous plan discussed by my previous partner versus proceeding with a hysterectomy at her age. Patient is contemplating a leaving her option of fertility for the future although she is not in a relationship at the current time. Pap smear was done today along with the following labs: TSH, compress metabolic panel, screening cholesterol, CBC, and  urinalysis. Patient will be instructed to begin taking one iron tablet twice daily if her hemoglobin continues to indicate that she is anemic. She was reminded to do her monthly breast exam.  She is going to be prescribed Zoloft 50 mg daily for her depression. I encouraged her also to continue to follow up with her therapist. The risks benefits and pros and cons of the medication were discussed. Patient has good support and has no suicidal ideation.  Note: This dictation was prepared with  Dragon/digital dictation along withSmart phrase technology. Any transcriptional errors that result from this process are unintentional.   Ok Edwards MD, 7:37 AM 07/01/2013

## 2013-07-16 ENCOUNTER — Ambulatory Visit (INDEPENDENT_AMBULATORY_CARE_PROVIDER_SITE_OTHER): Payer: BC Managed Care – PPO

## 2013-07-16 ENCOUNTER — Other Ambulatory Visit: Payer: Self-pay | Admitting: Gynecology

## 2013-07-16 ENCOUNTER — Ambulatory Visit (INDEPENDENT_AMBULATORY_CARE_PROVIDER_SITE_OTHER): Payer: BC Managed Care – PPO | Admitting: Gynecology

## 2013-07-16 DIAGNOSIS — N852 Hypertrophy of uterus: Secondary | ICD-10-CM

## 2013-07-16 DIAGNOSIS — N8300 Follicular cyst of ovary, unspecified side: Secondary | ICD-10-CM

## 2013-07-16 DIAGNOSIS — D259 Leiomyoma of uterus, unspecified: Secondary | ICD-10-CM

## 2013-07-16 DIAGNOSIS — D251 Intramural leiomyoma of uterus: Secondary | ICD-10-CM

## 2013-07-16 DIAGNOSIS — N938 Other specified abnormal uterine and vaginal bleeding: Secondary | ICD-10-CM

## 2013-07-16 DIAGNOSIS — N949 Unspecified condition associated with female genital organs and menstrual cycle: Secondary | ICD-10-CM

## 2013-07-16 DIAGNOSIS — D252 Subserosal leiomyoma of uterus: Secondary | ICD-10-CM

## 2013-07-16 DIAGNOSIS — N920 Excessive and frequent menstruation with regular cycle: Secondary | ICD-10-CM

## 2013-07-16 DIAGNOSIS — D250 Submucous leiomyoma of uterus: Secondary | ICD-10-CM

## 2013-07-16 DIAGNOSIS — D649 Anemia, unspecified: Secondary | ICD-10-CM

## 2013-07-16 NOTE — Patient Instructions (Addendum)
Myomectomy Myoma is a non-cancerous tumor made up of fibrous tissue. It is also called leiomyoma, but more often called a fibroid tumor. Myomectomy is the removal of a fibroid tumor without removing another organ, like the uterus or ovary, with it. Fibroids range from the size of a pea to a grapefruit. They are rarely cancerous. Myomas only need treatment when they are growing or when they cause symptoms, such aspain, pressure, bleeding, and pain with intercourse. LET YOUR CAREGIVER KNOW ABOUT:  Any allergies, especially to medicines.  If you develop a cold or an infection before your surgery.  Medicines taken, including vitamins, herbs, eyedrops, over-ther-counter medicines, and creams.  Use of steroids (by mouth or creams).  Previous problems with numbing medicines.  History of blood clots or other bleeding problems.  Other health problems, such as diabetes, kidney, heart, or lung problems.  Previous surgery.  Possibility of pregnancy, if this applies. RISKS AND COMPLICATIONS   Excessive bleeding.  Infection.  Injury to other organs.  Blood clots in the legs, chest, and brain.  Scar tissue (adhesions) on other organs and in the pelvis.  Death during or after the surgery. BEFORE THE PROCEDURE  Follow your caregiver's advice regarding your surgery and preparing for surgery.  Avoid taking aspirin or blood thinners as directed by your caregiver.  DO NOT eat or drink anything after midnight on the night before surgery, or as directed by your caregiver.  DO NOT smoke (if you smoke) for 2 weeks before the surgery.  DO NOT drink alcohol the day before the surgery.  If you are admitted the day of the surgery,arrive1 hour before your surgery is scheduled.  Arrange to have someone take you home from the hospital.  Arrange to have someone care for you when you go home. PROCEDURE There are several ways to perform a myomectomy:  Hysteroscopy myomectomy. A lighted tube is  inserted inside the uterus. The tube will remove the fibroid. This is used when the fibroid is inside the cavity of the uterus.  Laparoscopic myomectomy. A long, lighted tube is inserted through 2 or 3 small incisions to see the organs in the pelvis. The fibroid is removed.  Myomectomy through a sugical cut (incicion) in the abdomen. The fibroid is removed through an incision made in the stomach. This way is performed when thethe fibroid cannot be removed with a hysteroscope or laprascope. AFTER THE PROCEDURE  If you had laparoscopic or hysteroscopic myomectomy, you may go home the same day or stay overnight.  If you had abdominal myomectomy, you may stay in the hospital a few days.  Your intravenous (IV)access tube and catheter will be removed in 1 or 2 days.  If you stay in the hospital, your caregiver will order pain medicine and a sleeping pill, if needed.  You may be placed on an antibiotic medicine, if needed.  You may be given written instructions and medicines before you are sent home. Document Released: 06/11/2007 Document Revised: 11/06/2011 Document Reviewed: 06/23/2009 [facility] Patient Information 2014 [place], [place].  Uterine Artery Embolization for Fibroids Uterine fibroids are non-cancerous (benign) smooth muscle tumors of the uterus. When they become large, they may produce symptoms of pain and bleeding. Fibroids are sometimes individually removed during surgery or removed with the uterus (hysterectomy).  One non-surgical treatment used to shrink fibroids is called uterine artery embolization. A specialist (interventional radiologist) uses a thin plastic hose(catheter) to inject material that blocks off the blood supply to the fibroid. In time, this causes the  fibroid to shrink. PROCEDURE  Under local anesthetic (a medication that numbs part of the body) the radiologist makes a small cut in the groin. A catheter is then inserted into the main artery of the leg. Using  fluoroscopy, your radiologist guides the catheter through the artery to the uterus. A series of images are taken while dye is injected. This is done to provide a road map of the blood supply to the uterus and fibroids. Tiny plastic spheres about the size of sand grains are then injected through the catheter. Metal coils may sometimes also be used to help block the artery. The particles lodge in tiny branches of the uterine artery that supplies blood to the fibroids. The procedure is repeated on the artery that supplies the other side of the uterus. The hospital stay is usually overnight. Normal activity can resume after about a week. Mild pain and cramping following the procedure is easily treated with medication and anti-inflammatory drugs. These usually last only a couple days.  RISKS AND COMPLICATIONS  Injury to the uterus from decreased blood supply may happen.  This could require removal of the uterus (hysterectomy).  Pain and bleeding can occur.  Infection and abscess (a cyst filled with pus).  A cyst filled with blood (hematoma).  Blood infection (septicemia).  Amenorrhea (no menstrual period).  Dying of tissue cells that cannot recover (necrosis) to the bladder or lips of the vagina.  Fistula (a connection between organs or from organ to the skin).  Blood clot in the lung (pulmonary embolus).  Rarely death. EXPECTED OUTCOME An ultrasound or MRI is done in 6 months to make sure the fibroids have shrunk. The fibroids usually shrink to about half their original size. In most cases these effects are long lasting.   The uterus also shrinks but does not die. You may not be able to get pregnant following this procedure.  It cannot be estimated what the effects of the procedure will be on menses. Usually there is less bleeding.  The procedure may cause premature menopause or loss of menstrual cycle. HOME CARE INSTRUCTIONS   Follow your caregiver's advice regarding medications given  to you, diet, activity and when to begin sexual activity.  See your caregiver for follow up care as directed.  Do not take aspirin it can cause bleeding. Only take over-the-counter or prescription medicines for pain, discomfort, or fever as directed by your caregiver.  Care for and change dressing as directed. SEEK MEDICAL CARE IF:   You develop a temperature of 102 F (38.9 C) or higher.  There is redness, swelling and pain around the wound.  You have pus draining from the wound.  You develop a rash. SEEK IMMEDIATE MEDICAL CARE IF:   You have bleeding from the wound.  You have difficulty breathing.  You develop chest pain.  You develop belly (abdominal) pain.  You develop leg pain.  You become dizzy and pass out. Document Released: 10/30/2005 Document Revised: 11/06/2011 Document Reviewed: 02/27/2013 [facility] Patient Information 2014 [place], [place].  Hysterectomy Information  A hysterectomy is a procedure where your uterus is surgically removed. It will no longer be possible to have menstrual periods or to become pregnant. The tubes and ovaries can be removed (bilateral salpingo-oopherectomy) during this surgery as well.  REASONS FOR A HYSTERECTOMY  Persistent, abnormal bleeding.  Lasting (chronic) pelvic pain or infection.  The lining of the uterus (endometrium) starts growing outside the uterus (endometriosis).  The endometrium starts growing in the muscle of the  uterus (adenomyosis).  The uterus falls down into the vagina (pelvic organ prolapse).  Symptomatic uterine fibroids.  Precancerous cells.  Cervical cancer or uterine cancer. TYPES OF HYSTERECTOMIES  Supracervical hysterectomy. This type removes the top part of the uterus, but not the cervix.  Total hysterectomy. This type removes the uterus and cervix.  Radical hysterectomy. This type removes the uterus, cervix, and the fibrous tissue that holds the uterus in place in the pelvis  (parametrium). WAYS A HYSTERECTOMY CAN BE PERFORMED  Abdominal hysterectomy. A large surgical cut (incision) is made in the abdomen. The uterus is removed through this incision.  Vaginal hysterectomy. An incision is made in the vagina. The uterus is removed through this incision. There are no abdominal incisions.  Conventional laparoscopic hysterectomy. A thin, lighted tube with a camera (laparoscope) is inserted into 3 or 4 small incisions in the abdomen. The uterus is cut into small pieces. The small pieces are removed through the incisions, or they are removed through the vagina.  Laparoscopic assisted vaginal hysterectomy (LAVH). Three or four small incisions are made in the abdomen. Part of the surgery is performed laparoscopically and part vaginally. The uterus is removed through the vagina.  Robot-assisted laparoscopic hysterectomy. A laparoscope is inserted into 3 or 4 small incisions in the abdomen. A computer-controlled device is used to give the surgeon a 3D image. This allows for more precise movements of surgical instruments. The uterus is cut into small pieces and removed through the incisions or removed through the vagina. RISKS OF HYSTERECTOMY   Bleeding and risk of blood transfusion. Tell your caregiver if you do not want to receive any blood products.  Blood clots in the legs or lung.  Infection.  Injury to surrounding organs.  Anesthesia problems or side effects.  Conversion to an abdominal hysterectomy. WHAT TO EXPECT AFTER A HYSTERECTOMY  You will be given pain medicine.  You will need to have someone with you for the first 3 to 5 days after you go home.  You will need to follow up with your surgeon in 2 to 4 weeks after surgery to evaluate your progress.  You may have early menopause symptoms like hot flashes, night sweats, and insomnia.  If you had a hysterectomy for a problem that was not a cancer or a condition that could lead to cancer, then you no longer  need Pap tests. However, even if you no longer need a Pap test, a regular exam is a good idea to make sure no other problems are starting. Document Released: 02/07/2001 Document Revised: 11/06/2011 Document Reviewed: 03/25/2011 [facility] Patient Information 2014 [place], [place].  Leuprolide injection What is this medicine? LEUPROLIDE (loo PROE lide) is a man-made hormone. It is used to treat the symptoms of prostate cancer. This medicine may also be used to treat children with early onset of puberty. It may be used for other hormonal conditions. This medicine may be used for other purposes; ask your health care provider or pharmacist if you have questions. COMMON BRAND NAME(S): Lupron What should I tell my health care provider before I take this medicine? They need to know if you have any of these conditions: -diabetes -heart disease or previous heart attack -high blood pressure -high cholesterol -pain or difficulty passing urine -spinal cord metastasis -stroke -tobacco smoker -an unusual or allergic reaction to leuprolide, benzyl alcohol, other medicines, foods, dyes, or preservatives -pregnant or trying to get pregnant -breast-feeding How should I use this medicine? This medicine is for injection under  the skin or into a muscle. You will be taught how to prepare and give this medicine. Use exactly as directed. Take your medicine at regular intervals. Do not take your medicine more often than directed. It is important that you put your used needles and syringes in a special sharps container. Do not put them in a trash can. If you do not have a sharps container, call your pharmacist or healthcare provider to get one. Talk to your pediatrician regarding the use of this medicine in children. While this medicine may be prescribed for children as young as 8 years for selected conditions, precautions do apply. Overdosage: If you think you have taken too much of this medicine contact a poison  control center or emergency room at once. NOTE: This medicine is only for you. Do not share this medicine with others. What if I miss a dose? If you miss a dose, take it as soon as you can. If it is almost time for your next dose, take only that dose. Do not take double or extra doses. What may interact with this medicine? Do not take this medicine with any of the following medications: -chasteberry This medicine may also interact with the following medications: -herbal or dietary supplements, like black cohosh or DHEA -female hormones, like estrogens or progestins and birth control pills, patches, rings, or injections -female hormones, like testosterone This list may not describe all possible interactions. Give your health care provider a list of all the medicines, herbs, non-prescription drugs, or dietary supplements you use. Also tell them if you smoke, drink alcohol, or use illegal drugs. Some items may interact with your medicine. What should I watch for while using this medicine? Visit your doctor or health care professional for regular checks on your progress. During the first week, your symptoms may get worse, but then will improve as you continue your treatment. You may get hot flashes, increased bone pain, increased difficulty passing urine, or an aggravation of nerve symptoms. Discuss these effects with your doctor or health care professional, some of them may improve with continued use of this medicine. Female patients may experience a menstrual cycle or spotting during the first 2 months of therapy with this medicine. If this continues, contact your doctor or health care professional. What side effects may I notice from receiving this medicine? Side effects that you should report to your doctor or health care professional as soon as possible: -allergic reactions like skin rash, itching or hives, swelling of the face, lips, or tongue -breathing problems -chest pain -depression or memory  disorders -pain in your legs or groin -pain at site where injected -severe headache -swelling of the feet and legs -visual changes -vomiting Side effects that usually do not require medical attention (report to your doctor or health care professional if they continue or are bothersome): -breast swelling or tenderness -decrease in sex drive or performance -diarrhea -hot flashes -loss of appetite -muscle, joint, or bone pains -nausea -redness or irritation at site where injected -skin problems or acne This list may not describe all possible side effects. Call your doctor for medical advice about side effects. You may report side effects to FDA at 1-800-FDA-1088. Where should I keep my medicine? Keep out of the reach of children. Store below 25 degrees C (77 degrees F). Do not freeze. Protect from light. Do not use if it is not clear or if there are particles present. Throw away any unused medicine after the expiration date. NOTE: This sheet  is a summary. It may not cover all possible information. If you have questions about this medicine, talk to your doctor, pharmacist, or health care provider.  2014, [employer]vier/[employer]. (2008-12-29 13:26:20)

## 2013-07-16 NOTE — Progress Notes (Signed)
The patient presented to the office today for a sonohysterogram as part of her evaluation of her metromenorrhagia and fibroid uterus and anemia history. See previous note November 4 when she came in as a new patient with the above-mentioned symptoms and her annual exam. During that examination her uterus felt to be approximately 14 week size and irregular.the following labs were ordered:  CBC: Hemoglobin 9.4 and hematocrit 29.6 (low indices), platelet count 454,000. Comprehensive metabolic panel and TSH and urinalysis were all normal.  Endometrial biopsy result as follows: Diagnosis Endometrium, biopsy, uterus - PROLIFERATIVE PHASE ENDOMETRIUM WITH BREAKDOWN. - NEGATIVE FOR HYPERPLASIA OR MALIGNANCY. - BENIGN ENDOCERVICAL MUCOSA.  Pap smear was normal  Patient underwent a sonohysterogram today with results as follows: Uterus measures 14.7 x 12.0 x 10.5 with endometrial stripe of 11.2 mm. The cervix is counseled that the dye solution and a sterile Pipelle was introduced into the uterine cavity. Uterus is found to be enlarged with multiple fibroids intramural and subserosal and submucosal. The largest myoma measured 65 x 44 mm. Right and left ovary were normal. A large submucous myoma measuring 4.7 x 2.8 x 4.2 cm was noted.  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 She only has 1 child which died at a very young age although she is currently not married. We discussed different options to include either abdominal myomectomy or abdominal hysterectomy with ovarian conservation. She is going to be started on Lupron 11.25 mg IM and have her continue on her iron tablet one twice a day. She will return back to the office in 2 months to recheck her CBC and see if she has decided which route to proceed. We did discuss uterine artery embolization although not recommended if she wanted to maintain her fertility. The risks  benefits and pros and cons of Lupron was discussed as well as recommending condoms for contraception and she becomes sexually active all medication. Literature information was provided on all the above subject and we will follow accordingly.

## 2013-07-29 ENCOUNTER — Telehealth: Payer: Self-pay

## 2013-07-29 NOTE — Telephone Encounter (Signed)
I spoke with [facility] pharmacy to see why we have not received patient's Lupron 11.25.  The pharmacy tells me that they have called and left message for the patient every day since 07/18/13 but have not heard from her.  I called and left message that pharmacy needs to hear from her to authorize shipment of medication.

## 2013-08-07 ENCOUNTER — Telehealth: Payer: Self-pay

## 2013-08-07 NOTE — Telephone Encounter (Signed)
[facility] called. Unable to reach patient. Have been calling her every day. They have worked out payment assistance for her and have it to $50 for Lupron 11.25 but cannot reach her. They were asking if I have any other contact numbers but I do note.  I tried to call patient as well but her voice mail box was full and I could not leave a message.

## 2013-08-13 ENCOUNTER — Telehealth: Payer: Self-pay

## 2013-08-13 NOTE — Telephone Encounter (Signed)
Patient called about ordering Lupron.  I explained to her that it has been ordered. I sent all paperwork a month ago. [facility] pharmacy has been leaving messages and trying to reach her since the medication was ordered.  I explained to her that she must call and talk with them to hear the ins benefits/cost and she is the one to authorize shipment. I explained shipment, once authorized by her, usually takes about a week and I was not sure how the holiday might affect that.   She took the phone number for the pharmacy and said she will contact them.

## 2013-08-14 ENCOUNTER — Ambulatory Visit (INDEPENDENT_AMBULATORY_CARE_PROVIDER_SITE_OTHER): Payer: BC Managed Care – PPO | Admitting: Gynecology

## 2013-08-14 ENCOUNTER — Other Ambulatory Visit: Payer: BC Managed Care – PPO

## 2013-08-14 ENCOUNTER — Encounter: Payer: Self-pay | Admitting: Gynecology

## 2013-08-14 DIAGNOSIS — A499 Bacterial infection, unspecified: Secondary | ICD-10-CM

## 2013-08-14 DIAGNOSIS — N760 Acute vaginitis: Secondary | ICD-10-CM

## 2013-08-14 DIAGNOSIS — Z113 Encounter for screening for infections with a predominantly sexual mode of transmission: Secondary | ICD-10-CM

## 2013-08-14 DIAGNOSIS — N898 Other specified noninflammatory disorders of vagina: Secondary | ICD-10-CM

## 2013-08-14 DIAGNOSIS — B9689 Other specified bacterial agents as the cause of diseases classified elsewhere: Secondary | ICD-10-CM

## 2013-08-14 DIAGNOSIS — R350 Frequency of micturition: Secondary | ICD-10-CM

## 2013-08-14 LAB — URINALYSIS W MICROSCOPIC + REFLEX CULTURE
Bilirubin Urine: NEGATIVE
Glucose, UA: NEGATIVE mg/dL
Hgb urine dipstick: NEGATIVE
Ketones, ur: NEGATIVE mg/dL
Nitrite: NEGATIVE
Specific Gravity, Urine: 1.02 (ref 1.005–1.030)
pH: 5.5 (ref 5.0–8.0)

## 2013-08-14 LAB — RPR

## 2013-08-14 LAB — HEPATITIS C ANTIBODY: HCV Ab: NEGATIVE

## 2013-08-14 LAB — WET PREP FOR TRICH, YEAST, CLUE: Yeast Wet Prep HPF POC: NONE SEEN

## 2013-08-14 MED ORDER — TINIDAZOLE 500 MG PO TABS: ORAL_TABLET | ORAL | Status: DC

## 2013-08-14 NOTE — Patient Instructions (Signed)
Bacterial Vaginosis Bacterial vaginosis (BV) is a vaginal infection where the normal balance of bacteria in the vagina is disrupted. The normal balance is then replaced by an overgrowth of certain bacteria. There are several different kinds of bacteria that can cause BV. BV is the most common vaginal infection in women of childbearing age. CAUSES   The cause of BV is not fully understood. BV develops when there is an increase or imbalance of harmful bacteria.  Some activities or behaviors can upset the normal balance of bacteria in the vagina and put women at increased risk including:  Having a new sex partner or multiple sex partners.  Douching.  Using an intrauterine device (IUD) for contraception.  It is not clear what role sexual activity plays in the development of BV. However, women that have never had sexual intercourse are rarely infected with BV. Women do not get BV from toilet seats, bedding, swimming pools or from touching objects around them.  SYMPTOMS   Grey vaginal discharge.  A fish-like odor with discharge, especially after sexual intercourse.  Itching or burning of the vagina and vulva.  Burning or pain with urination.  Some women have no signs or symptoms at all. DIAGNOSIS  Your caregiver must examine the vagina for signs of BV. Your caregiver will perform lab tests and look at the sample of vaginal fluid through a microscope. They will look for bacteria and abnormal cells (clue cells), a pH test higher than 4.5, and a positive amine test all associated with BV.  RISKS AND COMPLICATIONS   Pelvic inflammatory disease (PID).  Infections following gynecology surgery.  Developing HIV.  Developing herpes virus. TREATMENT  Sometimes BV will clear up without treatment. However, all women with symptoms of BV should be treated to avoid complications, especially if gynecology surgery is planned. Female partners generally do not need to be treated. However, BV may spread  between female sex partners so treatment is helpful in preventing a recurrence of BV.   BV may be treated with antibiotics. The antibiotics come in either pill or vaginal cream forms. Either can be used with nonpregnant or pregnant women, but the recommended dosages differ. These antibiotics are not harmful to the baby.  BV can recur after treatment. If this happens, a second round of antibiotics will often be prescribed.  Treatment is important for pregnant women. If not treated, BV can cause a premature delivery, especially for a pregnant woman who had a premature birth in the past. All pregnant women who have symptoms of BV should be checked and treated.  For chronic reoccurrence of BV, treatment with a type of prescribed gel vaginally twice a week is helpful. HOME CARE INSTRUCTIONS   Finish all medication as directed by your caregiver.  Do not have sex until treatment is completed.  Tell your sexual partner that you have a vaginal infection. They should see their caregiver and be treated if they have problems, such as a mild rash or itching.  Practice safe sex. Use condoms. Only have 1 sex partner. PREVENTION  Basic prevention steps can help reduce the risk of upsetting the natural balance of bacteria in the vagina and developing BV:  Do not have sexual intercourse (be abstinent).  Do not douche.  Use all of the medicine prescribed for treatment of BV, even if the signs and symptoms go away.  Tell your sex partner if you have BV. That way, they can be treated, if needed, to prevent reoccurrence. SEEK MEDICAL CARE IF:     Your symptoms are not improving after 3 days of treatment.  You have increased discharge, pain, or fever. MAKE SURE YOU:   Understand these instructions.  Will watch your condition.  Will get help right away if you are not doing well or get worse. FOR MORE INFORMATION  Division of STD Prevention (DSTDP), Centers for Disease Control and Prevention:  www.cdc.gov/std American [employer] Association (ASHA): www.ashastd.org  Document Released: 08/14/2005 Document Revised: 11/06/2011 Document Reviewed: 03/26/2013 [employer] Patient Information 2014 [employer].  

## 2013-08-14 NOTE — Progress Notes (Signed)
   37 year old patient that presented to the office today complaining of vaginal odor and discharge her past few days. Patient has a new sexual partner for the beginning of this month. Patient interested in STD screen. She also had some frequency but no dysuria. She stated that she has felt moist at times. She denies any back pain, fever, chills, nausea, or vomiting. She stated that she had recently did vaginal douching. Patient with known history of leiomyomatous uteri has been using condoms for contraception recent normal last menstrual periiod   Exam: Bartholin's urethra Skene's is within normal limits Vagina: A fishy odor was present in the vagina along with a clear discharge. Cervix: No lesions were seen GC and chlamydia culture was obtained along with a wet prep Uterus: Patient with 14 week size leiomyomatous uteri Adnexa: Difficult to assess due to fibroid uterus Rectal exam: Not done  Wet prep:Pos Amine, many clue cells, 2 numerous to count bacteria  GC and chlamydia culture pending at time  Urinalysis: Negative  Assessment/plan: Clinical evidence by tear vaginosis. Patient will be placed on Tindamax (4) 500 mg tablet today and repeat in 24 hours. GC and chlamydia culture along with hepatitis B, hepatitis C, RPR, and HIV pending at time of this dictation.

## 2013-08-15 LAB — GC/CHLAMYDIA PROBE AMP: GC Probe RNA: NEGATIVE

## 2013-09-09 ENCOUNTER — Ambulatory Visit (INDEPENDENT_AMBULATORY_CARE_PROVIDER_SITE_OTHER): Payer: BC Managed Care – PPO | Admitting: Gynecology

## 2013-09-09 ENCOUNTER — Encounter: Payer: Self-pay | Admitting: Gynecology

## 2013-09-09 VITALS — BP 120/78

## 2013-09-09 DIAGNOSIS — N898 Other specified noninflammatory disorders of vagina: Secondary | ICD-10-CM

## 2013-09-09 DIAGNOSIS — N760 Acute vaginitis: Secondary | ICD-10-CM

## 2013-09-09 DIAGNOSIS — N925 Other specified irregular menstruation: Secondary | ICD-10-CM

## 2013-09-09 DIAGNOSIS — N938 Other specified abnormal uterine and vaginal bleeding: Secondary | ICD-10-CM

## 2013-09-09 DIAGNOSIS — D259 Leiomyoma of uterus, unspecified: Secondary | ICD-10-CM

## 2013-09-09 DIAGNOSIS — D649 Anemia, unspecified: Secondary | ICD-10-CM

## 2013-09-09 DIAGNOSIS — N949 Unspecified condition associated with female genital organs and menstrual cycle: Secondary | ICD-10-CM

## 2013-09-09 DIAGNOSIS — Z113 Encounter for screening for infections with a predominantly sexual mode of transmission: Secondary | ICD-10-CM

## 2013-09-09 DIAGNOSIS — B9689 Other specified bacterial agents as the cause of diseases classified elsewhere: Secondary | ICD-10-CM

## 2013-09-09 DIAGNOSIS — A499 Bacterial infection, unspecified: Secondary | ICD-10-CM

## 2013-09-09 LAB — WET PREP FOR TRICH, YEAST, CLUE
TRICH WET PREP: NONE SEEN
WBC, Wet Prep HPF POC: NONE SEEN
YEAST WET PREP: NONE SEEN

## 2013-09-09 MED ORDER — LEUPROLIDE ACETATE (3 MONTH) 11.25 MG IM KIT
11.2500 mg | PACK | Freq: Once | INTRAMUSCULAR | Status: AC
  Administered 2013-09-09: 11.25 mg via INTRAMUSCULAR

## 2013-09-09 MED ORDER — METRONIDAZOLE 500 MG PO TABS: 500.0000 mg | ORAL_TABLET | Freq: Two times a day (BID) | ORAL | Status: DC

## 2013-09-09 NOTE — Progress Notes (Signed)
   Patient presented to the office today with a complaint of dysfunctional uterine bleeding and also feeling that she was getting another bacterial vaginosis infection again. Her partner is living in [place] and in December when he came in she had intercourse she presented to the office was diagnosed with BV treated and had a full STD screen which was negative. Review of patient's records had indicated that she has a 15 week size uterus and wasn't sure if to proceed with abdominal myomectomy versus total abdominal hysterectomy with ovarian conservation. She has decided that she wants definitive surgery and is here today to receive also her Lupron 11.25 mg daily in an effort to slow down her bleeding and improve her hemoglobin which was 9.4 g recently. See previous notes outlining her past endometrial biopsy and ultrasound.  Exam today: Bartholin urethra Skene glands within normal limits Vagina: Blood was present in the vaginal vault Cervix: Blood was seen. Uterus 15 week size irregular nontender Adnexa difficult to assess secondary to fibroid uterus Rectal exam: Not done  Wet prep was moderate bacteria GC and Chlamydia culture was repeated today.  Assessment/plan: Patient will be prescribed Flagyl 500 mg twice a day for 7 days. She will continue to take her arm twice a day as previously prescribed and recommended. She will receive Lupron 11.25 mg IM today. Literature information on Lupron outlining risks benefits and pros and cons. All questions are answered. If she were to become sexually active with her partner she should use barrier contraception. Patient will return back to the office in 3 months approximately 2 weeks before her plan abdominal hysterectomy for preop consultation/ exam/ultrasound/and CBC. Literature information also was provided on hysterectomy.

## 2013-09-09 NOTE — Patient Instructions (Signed)
Bacterial Vaginosis Bacterial vaginosis is a vaginal infection that occurs when the normal balance of bacteria in the vagina is disrupted. It results from an overgrowth of certain bacteria. This is the most common vaginal infection in women of childbearing age. Treatment is important to prevent complications, especially in pregnant women, as it can cause a premature delivery. CAUSES  Bacterial vaginosis is caused by an increase in harmful bacteria that are normally present in smaller amounts in the vagina. Several different kinds of bacteria can cause bacterial vaginosis. However, the reason that the condition develops is not fully understood. RISK FACTORS Certain activities or behaviors can put you at an increased risk of developing bacterial vaginosis, including:  Having a new sex partner or multiple sex partners.  Douching.  Using an intrauterine device (IUD) for contraception. Women do not get bacterial vaginosis from toilet seats, bedding, swimming pools, or contact with objects around them. SIGNS AND SYMPTOMS  Some women with bacterial vaginosis have no signs or symptoms. Common symptoms include:  Grey vaginal discharge.  A fishlike odor with discharge, especially after sexual intercourse.  Itching or burning of the vagina and vulva.  Burning or pain with urination. DIAGNOSIS  Your health care provider will take a medical history and examine the vagina for signs of bacterial vaginosis. A sample of vaginal fluid may be taken. Your health care provider will look at this sample under a microscope to check for bacteria and abnormal cells. A vaginal pH test may also be done.  TREATMENT  Bacterial vaginosis may be treated with antibiotic medicines. These may be given in the form of a pill or a vaginal cream. A second round of antibiotics may be prescribed if the condition comes back after treatment.  HOME CARE INSTRUCTIONS   Only take over-the-counter or prescription medicines as  directed by your health care provider.  If antibiotic medicine was prescribed, take it as directed. Make sure you finish it even if you start to feel better.  Do not have sex until treatment is completed.  Tell all sexual partners that you have a vaginal infection. They should see their health care provider and be treated if they have problems, such as a mild rash or itching.  Practice safe sex by using condoms and only having one sex partner. SEEK MEDICAL CARE IF:   Your symptoms are not improving after 3 days of treatment.  You have increased discharge or pain.  You have a fever. MAKE SURE YOU:   Understand these instructions.  Will watch your condition.  Will get help right away if you are not doing well or get worse. FOR MORE INFORMATION  Centers for Disease Control and Prevention, Division of STD Prevention: AppraiserFraud.fi American Sexual Health Association (ASHA): www.ashastd.org  Document Released: 08/14/2005 Document Revised: 06/04/2013 Document Reviewed: 03/26/2013 [facility] Patient Information 2014 [place]. Hysterectomy Information  A hysterectomy is a surgery in which your uterus is removed. This surgery may be done to treat various medical problems. After the surgery, you will no longer have menstrual periods. The surgery will also make you unable to become pregnant (sterile). The fallopian tubes and ovaries can be removed (bilateral salpingo-oophorectomy) during this surgery as well.  REASONS FOR A HYSTERECTOMY  Persistent, abnormal bleeding.  Lasting (chronic) pelvic pain or infection.  The lining of the uterus (endometrium) starts growing outside the uterus (endometriosis).  The endometrium starts growing in the muscle of the uterus (adenomyosis).  The uterus falls down into the vagina (pelvic organ prolapse).  Noncancerous  growths in the uterus (uterine fibroids) that cause symptoms.  Precancerous cells.  Cervical cancer or uterine cancer. TYPES  OF HYSTERECTOMIES  Supracervical hysterectomy In this type, the top part of the uterus is removed, but not the cervix.  Total hysterectomy The uterus and cervix are removed.  Radical hysterectomy The uterus, the cervix, and the fibrous tissue that holds the uterus in place in the pelvis (parametrium) are removed. WAYS A HYSTERECTOMY CAN BE PERFORMED  Abdominal hysterectomy A large surgical cut (incision) is made in the abdomen. The uterus is removed through this incision.  Vaginal hysterectomy An incision is made in the vagina. The uterus is removed through this incision. There are no abdominal incisions.  Conventional laparoscopic hysterectomy Three or four small incisions are made in the abdomen. A thin, lighted tube with a camera (laparoscope) is inserted into one of the incisions. Other tools are put through the other incisions. The uterus is cut into small pieces. The small pieces are removed through the incisions, or they are removed through the vagina.  Laparoscopically assisted vaginal hysterectomy (LAVH) Three or four small incisions are made in the abdomen. Part of the surgery is performed laparoscopically and part vaginally. The uterus is removed through the vagina.  Robot-assisted laparoscopic hysterectomy A laparoscope and other tools are inserted into 3 or 4 small incisions in the abdomen. A computer-controlled device is used to give the surgeon a 3D image and to help control the surgical instruments. This allows for more precise movements of surgical instruments. The uterus is cut into small pieces and removed through the incisions or removed through the vagina. RISKS AND COMPLICATIONS  Possible complications associated with this procedure include:  Bleeding and risk of blood transfusion. Tell your health care provider if you do not want to receive any blood products.  Blood clots in the legs or lung.  Infection.  Injury to surrounding organs.  Problems or side effects  related to anesthesia.  Conversion to an abdominal hysterectomy from one of the other techniques. WHAT TO EXPECT AFTER A HYSTERECTOMY  You will be given pain medicine.  You will need to have someone with you for the first 3 5 days after you go home.  You will need to follow up with your surgeon in 2 4 weeks after surgery to evaluate your progress.  You may have early menopause symptoms such as hot flashes, night sweats, and insomnia.  If you had a hysterectomy for a problem that was not cancer or not a condition that could lead to cancer, then you no longer need Pap tests. However, even if you no longer need a Pap test, a regular exam is a good idea to make sure no other problems are starting. Document Released: 02/07/2001 Document Revised: 06/04/2013 Document Reviewed: 04/21/2013 [facility] Patient Information 2014 [place]. Leuprolide depot injection or implant What is this medicine? LEUPROLIDE (loo PROE lide) is a man-made protein that acts like a natural hormone in the body. It decreases testosterone in men and decreases estrogen in women. In men, this medicine is used to treat advanced prostate cancer. In women, some forms of this medicine may be used to treat endometriosis, uterine fibroids, or other female hormone-related problems. This medicine may be used for other purposes; ask your health care provider or pharmacist if you have questions. COMMON BRAND NAME(S): Eligard, Lupron Depot, Lupron Depot-Ped, Viadur What should I tell my health care provider before I take this medicine? They need to know if you have any of these  conditions: -diabetes -heart disease or previous heart attack -high blood pressure -high cholesterol -osteoporosis -pain or difficulty passing urine -spinal cord metastasis -stroke -tobacco smoker -unusual vaginal bleeding (women) -an unusual or allergic reaction to leuprolide, benzyl alcohol, other medicines, foods, dyes, or preservatives -pregnant  or trying to get pregnant -breast-feeding How should I use this medicine? This medicine is for injection into a muscle or for implant or injection under the skin. It is given by a health care professional in a hospital or clinic setting. The specific product will determine how it will be given to you. Make sure you understand which product you receive and how often you will receive it. Talk to your pediatrician regarding the use of this medicine in children. Special care may be needed. Overdosage: If you think you have taken too much of this medicine contact a poison control center or emergency room at once. NOTE: This medicine is only for you. Do not share this medicine with others. What if I miss a dose? It is important not to miss a dose. Call your doctor or health care professional if you are unable to keep an appointment. Depot injections: Depot injections are given either once-monthly, every 12 weeks, every 16 weeks, or every 24 weeks depending on the product you are prescribed. The product you are prescribed will be based on if you are female or female, and your condition. Make sure you understand your product and dosing. Implant dosing: The implant is removed and replaced once a year. The implant is only used in males. What may interact with this medicine? Do not take this medicine with any of the following medications: -chasteberry This medicine may also interact with the following medications: -herbal or dietary supplements, like black cohosh or DHEA -female hormones, like estrogens or progestins and birth control pills, patches, rings, or injections -female hormones, like testosterone This list may not describe all possible interactions. Give your health care provider a list of all the medicines, herbs, non-prescription drugs, or dietary supplements you use. Also tell them if you smoke, drink alcohol, or use illegal drugs. Some items may interact with your medicine. What should I watch for  while using this medicine? Visit your doctor or health care professional for regular checks on your progress. During the first weeks of treatment, your symptoms may get worse, but then will improve as you continue your treatment. You may get hot flashes, increased bone pain, increased difficulty passing urine, or an aggravation of nerve symptoms. Discuss these effects with your doctor or health care professional, some of them may improve with continued use of this medicine. Female patients may experience a menstrual cycle or spotting during the first months of therapy with this medicine. If this continues, contact your doctor or health care professional. What side effects may I notice from receiving this medicine? Side effects that you should report to your doctor or health care professional as soon as possible: -allergic reactions like skin rash, itching or hives, swelling of the face, lips, or tongue -breathing problems -chest pain -depression or memory disorders -pain in your legs or groin -pain at site where injected or implanted -severe headache -swelling of the feet and legs -visual changes -vomiting Side effects that usually do not require medical attention (report to your doctor or health care professional if they continue or are bothersome): -breast swelling or tenderness -decrease in sex drive or performance -diarrhea -hot flashes -loss of appetite -muscle, joint, or bone pains -nausea -redness or irritation at site  where injected or implanted -skin problems or acne This list may not describe all possible side effects. Call your doctor for medical advice about side effects. You may report side effects to FDA at 1-800-FDA-1088. Where should I keep my medicine? This drug is given in a hospital or clinic and will not be stored at home. NOTE: This sheet is a summary. It may not cover all possible information. If you have questions about this medicine, talk to your doctor,  pharmacist, or health care provider.  2014, Elsevier/Gold Standard. (2010-02-15 14:41:21) Uterine Fibroid A uterine fibroid is a growth (tumor) that occurs in your uterus. This type of tumor is not cancerous and does not spread out of the uterus. You can have one or many fibroids. Fibroids can vary in size, weight, and where they grow in the uterus. Some can become quite large. Most fibroids do not require medical treatment, but some can cause pain or heavy bleeding during and between periods. CAUSES  A fibroid is the result of a single uterine cell that keeps growing (unregulated), which is different than most cells in the human body. Most cells have a control mechanism that keeps them from reproducing without control.  SIGNS AND SYMPTOMS   Bleeding.  Pelvic pain and pressure.  Bladder problems due to the size of the fibroid.  Infertility and miscarriages depending on the size and location of the fibroid. DIAGNOSIS  Uterine fibroids are diagnosed through a physical exam. Your health care provider may feel the lumpy tumors during a pelvic exam. Ultrasonography may be done to get information regarding size, location, and number of tumors.  TREATMENT   Your health care provider may recommend watchful waiting. This involves getting the fibroid checked by your health care provider to see if it grows or shrinks.   Hormone treatment or an intrauterine device (IUD) may be prescribed.   Surgery may be needed to remove the fibroids (myomectomy) or the uterus (hysterectomy). This depends on your situation. When fibroids interfere with fertility and a woman wants to become pregnant, a health care provider may recommend having the fibroids removed.  Alligator care depends on how you were treated. In general:   Keep all follow-up appointments with your health care provider.   Only take over-the-counter or prescription medicines as directed by your health care provider. If you  were prescribed a hormone treatment, take the hormone medicines exactly as directed. Do not take aspirin. It can cause bleeding.   Talk to your health care provider about taking iron pills.  If your periods are troublesome but not so heavy, lie down with your feet raised slightly above your heart. Place cold packs on your lower abdomen.   If your periods are heavy, write down the number of pads or tampons you use per month. Bring this information to your health care provider.   Include green vegetables in your diet.  SEEK IMMEDIATE MEDICAL CARE IF:  You have pelvic pain or cramps not controlled with medicines.   You have a sudden increase in pelvic pain.   You have an increase in bleeding between and during periods.   You have excessive periods and soak tampons or pads in a half hour or less.  You feel lightheaded or have fainting episodes. Document Released: 08/11/2000 Document Revised: 06/04/2013 Document Reviewed: 03/13/2013 [facility] Patient Information 2014 [place], [place].

## 2013-09-10 ENCOUNTER — Other Ambulatory Visit: Payer: Self-pay | Admitting: Gynecology

## 2013-09-10 ENCOUNTER — Telehealth: Payer: Self-pay

## 2013-09-10 LAB — GC/CHLAMYDIA PROBE AMP
CT Probe RNA: NEGATIVE
GC Probe RNA: NEGATIVE

## 2013-09-10 MED ORDER — FERROUS SULFATE 300 (60 FE) MG/5ML PO SYRP: 300.0000 mg | ORAL_SOLUTION | Freq: Two times a day (BID) | ORAL | Status: DC

## 2013-09-10 NOTE — Progress Notes (Signed)
This will be fine.  Thank you

## 2013-09-10 NOTE — Progress Notes (Signed)
Yes this would be fine.  Thank you!

## 2013-09-10 NOTE — Telephone Encounter (Signed)
I called patient to let her know I sent rx for liquid iron to her pharmacy.  She should take it until surgery.  I also let her know I do not have April schedule yet but will schedule her and call her as soon as I do.

## 2013-09-29 ENCOUNTER — Telehealth: Payer: Self-pay

## 2013-09-29 NOTE — Telephone Encounter (Signed)
Give it time. Not unusual so early since shot.

## 2013-09-29 NOTE — Telephone Encounter (Signed)
Patient advised.

## 2013-09-29 NOTE — Telephone Encounter (Signed)
LMP 08-23-13 . Had Lupron injection on 09/09/13.  Patient said she started spotting last week and today and yesterday is bleeding like a period.  She thought this injection was to suppress her bleeding.

## 2013-10-02 ENCOUNTER — Telehealth: Payer: Self-pay | Admitting: *Deleted

## 2013-10-02 NOTE — Telephone Encounter (Signed)
She can make an appointment with the interventional radiologist for consideration of uterine artery embolization for a second opinion then to see me for consultation in March if she wants to proceed with recommended Hysterectomy  in April

## 2013-10-02 NOTE — Telephone Encounter (Signed)
Pt is scheduled for abdominal hysterectomy on 12/15/13 she asked if there are any other options rather than having hysterectomy? She states you did speak with her about abdominal myomectomy, she asked are these the only two options for her? She voiced to me that she really doesn't want hysterectomy. Please advise

## 2013-10-07 ENCOUNTER — Telehealth: Payer: Self-pay | Admitting: *Deleted

## 2013-10-07 DIAGNOSIS — Z719 Counseling, unspecified: Secondary | ICD-10-CM

## 2013-10-07 MED ORDER — MEGESTROL ACETATE 40 MG PO TABS: 40.0000 mg | ORAL_TABLET | Freq: Two times a day (BID) | ORAL | Status: DC

## 2013-10-07 NOTE — Telephone Encounter (Signed)
Pt received Lupron injection on 09/09/13 and now c/o vaginal bleeding x 12 days now. Was heavy last week but now normal cycle flow wearing pads changing every 3-4 hours pt asked if this is normal to bleed this long? LMP: 09/25/13 ,pt said she has been bleeding every 2 weeks. Please advise

## 2013-10-07 NOTE — Telephone Encounter (Signed)
Pt informed with all the below note # given to interventional radiologist [phone] she will call to discuss.

## 2013-10-07 NOTE — Telephone Encounter (Signed)
Pt informed with the note Rx sent, pt said she does not want to have surgery in April and have "everything removed" asked if any other options besides abdominal hysterectomy and abdominal myomectomy? Pt seemed very unsure about what to do, would you like patient to make consult visit with you to discuss?

## 2013-10-07 NOTE — Telephone Encounter (Signed)
If she wants to discuss the option of uterine artery embolization please give her the number of the interventional radiologist at [facility] and they can talk to them about this option. The Lupron injection was only temporary to decrease the size of the fibroid and stop her bleeding in preparation for surgery. Once the medication was discontinued the fibroids get back to normal size. This medication is not recommended to be used for more than 6 months as we had discussed. If she wants to maintain her fertility the only other option was a myomectomy as we had discussed. Patient who are planning to get pregnant should not have uterine artery embolization. These are the only options. Her uterus is the size of a 4 month pregnancy due to the large fibroids. They will not go away by themselves. They did begin to decrease in size somewhat after the menopause. If they continue to grow after the menopause there is a risk for leiomyosarcoma.

## 2013-10-07 NOTE — Telephone Encounter (Signed)
This is because of her fibroids. Please call her in prescription for Megace 40mg  BID for ten days. Have her stay on the iron twice a day. I would like to see her next week to se if maybe we can also put in an IUD to further control her bleeding before her surgery in April.

## 2013-10-08 ENCOUNTER — Inpatient Hospital Stay: Admission: RE | Admit: 2013-10-08 | Payer: BC Managed Care – PPO | Source: Ambulatory Visit

## 2013-10-15 ENCOUNTER — Other Ambulatory Visit: Payer: Self-pay | Admitting: Gynecology

## 2013-10-15 DIAGNOSIS — D259 Leiomyoma of uterus, unspecified: Secondary | ICD-10-CM

## 2013-10-15 DIAGNOSIS — N920 Excessive and frequent menstruation with regular cycle: Secondary | ICD-10-CM

## 2013-10-15 DIAGNOSIS — D251 Intramural leiomyoma of uterus: Secondary | ICD-10-CM

## 2013-10-22 ENCOUNTER — Other Ambulatory Visit: Payer: Self-pay | Admitting: Gynecology

## 2013-10-24 ENCOUNTER — Telehealth: Payer: Self-pay

## 2013-10-24 NOTE — Telephone Encounter (Signed)
Patient called in voice mail stating she wants to cancel her surgery but wants to keep her pre-op u/s appt.

## 2013-10-27 ENCOUNTER — Telehealth: Payer: Self-pay

## 2013-10-27 NOTE — Telephone Encounter (Signed)
Patient called stating she would like to cancel her TAH scheduled for April 20th.  (She had Lupron 09/09/13.) I asked patient if she had other plans as I knew earlier she had asked about Ut Artery Embolization.  She said no, but that she wants to take 6 mos to explore other options and "alternative healing".  She stated that she still wanted to keep the pre op u/s appt as she wants to "assess the shrinkage of the fibroids".  Are you okay with her keeping that appointment and if so, should we still have her have CBC drawn at that visit.

## 2013-10-27 NOTE — Telephone Encounter (Signed)
Yes  Along with an ultrasound to see the effects of the Lupron after the injection was administered three months prior.

## 2013-10-27 NOTE — Telephone Encounter (Signed)
Called patient but her voice mail box is full.

## 2013-10-30 ENCOUNTER — Other Ambulatory Visit: Payer: Self-pay | Admitting: Gynecology

## 2013-10-30 DIAGNOSIS — D649 Anemia, unspecified: Secondary | ICD-10-CM

## 2013-10-30 NOTE — Telephone Encounter (Signed)
Patient informed. 

## 2013-12-03 ENCOUNTER — Other Ambulatory Visit: Payer: Self-pay | Admitting: Gynecology

## 2013-12-03 ENCOUNTER — Ambulatory Visit (INDEPENDENT_AMBULATORY_CARE_PROVIDER_SITE_OTHER): Payer: BC Managed Care – PPO | Admitting: Gynecology

## 2013-12-03 ENCOUNTER — Ambulatory Visit (INDEPENDENT_AMBULATORY_CARE_PROVIDER_SITE_OTHER): Payer: BC Managed Care – PPO

## 2013-12-03 ENCOUNTER — Other Ambulatory Visit: Payer: BC Managed Care – PPO

## 2013-12-03 ENCOUNTER — Encounter: Payer: Self-pay | Admitting: Gynecology

## 2013-12-03 ENCOUNTER — Ambulatory Visit: Payer: BC Managed Care – PPO | Admitting: Gynecology

## 2013-12-03 VITALS — BP 124/86

## 2013-12-03 DIAGNOSIS — D252 Subserosal leiomyoma of uterus: Secondary | ICD-10-CM

## 2013-12-03 DIAGNOSIS — N831 Corpus luteum cyst of ovary, unspecified side: Secondary | ICD-10-CM

## 2013-12-03 DIAGNOSIS — E860 Dehydration: Secondary | ICD-10-CM

## 2013-12-03 DIAGNOSIS — N852 Hypertrophy of uterus: Secondary | ICD-10-CM

## 2013-12-03 DIAGNOSIS — D251 Intramural leiomyoma of uterus: Secondary | ICD-10-CM

## 2013-12-03 DIAGNOSIS — D219 Benign neoplasm of connective and other soft tissue, unspecified: Secondary | ICD-10-CM

## 2013-12-03 DIAGNOSIS — Z01818 Encounter for other preprocedural examination: Secondary | ICD-10-CM

## 2013-12-03 DIAGNOSIS — D259 Leiomyoma of uterus, unspecified: Secondary | ICD-10-CM

## 2013-12-03 DIAGNOSIS — A499 Bacterial infection, unspecified: Secondary | ICD-10-CM

## 2013-12-03 DIAGNOSIS — N760 Acute vaginitis: Secondary | ICD-10-CM

## 2013-12-03 DIAGNOSIS — N920 Excessive and frequent menstruation with regular cycle: Secondary | ICD-10-CM

## 2013-12-03 DIAGNOSIS — N946 Dysmenorrhea, unspecified: Secondary | ICD-10-CM

## 2013-12-03 DIAGNOSIS — N898 Other specified noninflammatory disorders of vagina: Secondary | ICD-10-CM

## 2013-12-03 DIAGNOSIS — B9689 Other specified bacterial agents as the cause of diseases classified elsewhere: Secondary | ICD-10-CM

## 2013-12-03 DIAGNOSIS — D649 Anemia, unspecified: Secondary | ICD-10-CM

## 2013-12-03 LAB — COMPREHENSIVE METABOLIC PANEL
ALT: 13 U/L (ref 0–35)
AST: 21 U/L (ref 0–37)
Albumin: 4 g/dL (ref 3.5–5.2)
Alkaline Phosphatase: 76 U/L (ref 39–117)
BUN: 15 mg/dL (ref 6–23)
CO2: 18 mEq/L — ABNORMAL LOW (ref 19–32)
CREATININE: 0.87 mg/dL (ref 0.50–1.10)
Calcium: 8.6 mg/dL (ref 8.4–10.5)
Chloride: 105 mEq/L (ref 96–112)
Glucose, Bld: 91 mg/dL (ref 70–99)
Potassium: 4.7 mEq/L (ref 3.5–5.3)
Sodium: 135 mEq/L (ref 135–145)
Total Bilirubin: 0.4 mg/dL (ref 0.2–1.2)
Total Protein: 6.7 g/dL (ref 6.0–8.3)

## 2013-12-03 LAB — CBC WITH DIFFERENTIAL/PLATELET
BASOS PCT: 0 % (ref 0–1)
Basophils Absolute: 0 10*3/uL (ref 0.0–0.1)
Eosinophils Absolute: 0.2 10*3/uL (ref 0.0–0.7)
Eosinophils Relative: 3 % (ref 0–5)
HEMATOCRIT: 36.1 % (ref 36.0–46.0)
Hemoglobin: 12.3 g/dL (ref 12.0–15.0)
Lymphocytes Relative: 23 % (ref 12–46)
Lymphs Abs: 1.4 10*3/uL (ref 0.7–4.0)
MCH: 30.2 pg (ref 26.0–34.0)
MCHC: 34.1 g/dL (ref 30.0–36.0)
MCV: 88.7 fL (ref 78.0–100.0)
MONO ABS: 0.4 10*3/uL (ref 0.1–1.0)
Monocytes Relative: 7 % (ref 3–12)
NEUTROS ABS: 4.1 10*3/uL (ref 1.7–7.7)
NEUTROS PCT: 67 % (ref 43–77)
PLATELETS: 285 10*3/uL (ref 150–400)
RBC: 4.07 MIL/uL (ref 3.87–5.11)
RDW: 15.1 % (ref 11.5–15.5)
WBC: 6.1 10*3/uL (ref 4.0–10.5)

## 2013-12-03 LAB — WET PREP FOR TRICH, YEAST, CLUE
Trich, Wet Prep: NONE SEEN
WBC WET PREP: NONE SEEN
YEAST WET PREP: NONE SEEN

## 2013-12-03 MED ORDER — TINIDAZOLE 500 MG PO TABS: ORAL_TABLET | ORAL | Status: DC

## 2013-12-03 NOTE — Progress Notes (Addendum)
Katelyn [name] is an 38 y.o. female. For preoperative exam. Patient had previously been scheduled for total abdominal hysterectomy with bilateral salpingectomy and ovarian conservation but canceled. Patient with enlarging leiomyomatous uteri contributing to dysmenorrhea and menorrhagia and bloating. Patient was having a slight vaginal discharge. Patient had been treated in the past for bacterial vaginosis. And recently had a full STD screening which was negative. Patient had been placed on Lupron 11.25 mg on January 13 and was instructed to return to the office today for preoperative exam and rescheduling for her surgery as well as to look at her ultrasound.  Ultrasound in 2014: a large uterus with multiple fibroids. The largest one measures 6.5 x 3.8 cm. Approximately 8 fibroids have been identified. There appeared to be one submucosal with the description of having a dimension of 4 cm. Patient's ovaries were reported to be normal. Endometrial echo was enlarged at 23 mm. The sono infusion histogram confirmed a large submucous myoma which was described as filling the endometrial cavity with the addition of 2 small endometrial polyps.  An endometrial biopsy was done at that time with the following results:  Endometrium, biopsy  - SCANT GLANDULAR FRAGMENTS ADMIXED WITH ABUNDANT BLOOD AND MUCUS.  - SEE MICROSCOPIC DESCRIPTION.  Microscopic Comment  The specimen consists mostly of blood and mucus with scant admixed glandular cells most of which have  features consistent with endocervical origin. There is a rare fragment which may represent endometrium with  papillary syncytial metaplasia. This fragment is minute which hampers definitive evaluation. The amount of  glandular tissue present is limited.  Patient stated that back in 1993 when she was younger she had a laparoscopy and had a large left ovarian cyst that was drained. She also had a previous cesarean section at 35 weeks and her child died at  the age of 1. Patient has had a history of anemia for which she's currently taking to her tablets daily.  Ultrasound today: Uterus measured 14 x 13 x 10 cm with endometrial stripe of 4.7 mm. A large uterus with multiple subserosal and intramural myomas totaling 6 with the largest one measuring 63 x 47 mm. Right ovary normal left ovary thin wall cyst measuring 14 x 17 mm with internal low level echoes no fluid in the cul-de-sac. Fibroid noted to be displacing the uterus cavity.    Pertinent Gynecological History: Menses: Irregular Bleeding: dysfunctional uterine bleeding Contraception: condoms DES exposure: denies Blood transfusions: none Sexually transmitted diseases: no past history Previous GYN Procedures: Laparoscopy and cesarean section  Last mammogram: normal Date: 2010 Last pap: normal Date: 2014 OB History: G 1, P 1   Menstrual History: Menarche age: 53 Patient's last menstrual period was 11/11/2013.    Past Medical History  Diagnosis Date  . Anemia   . Hx gestational diabetes   . Fibroid     Past Surgical History  Procedure Laterality Date  . Cesarean section    . Pelvic laparoscopy  1993    Family History  Problem Relation Age of Onset  . Cancer Maternal Aunt     ovarian and uterine  . Breast cancer Maternal Aunt     unsure of age  . Diabetes Maternal Grandmother   . Hypertension Maternal Grandmother     Social History:  reports that she has quit smoking. She has never used smokeless tobacco. She reports that she drinks alcohol. She reports that she does not use illicit drugs.  Allergies:  Allergies  Allergen Reactions  .  Latex Other (See Comments)    Burning with discharge with condom use     (Not in a hospital admission)  REVIEW OF SYSTEMS: A ROS was performed and pertinent positives and negatives are included in the history.  GENERAL: No fevers or chills. HEENT: No change in vision, no earache, sore throat or sinus congestion. NECK: No pain or  stiffness. CARDIOVASCULAR: No chest pain or pressure. No palpitations. PULMONARY: No shortness of breath, cough or wheeze. GASTROINTESTINAL: No abdominal pain, nausea, vomiting or diarrhea, melena or bright red blood per rectum. GENITOURINARY: No urinary frequency, urgency, hesitancy or dysuria. MUSCULOSKELETAL: No joint or muscle pain, no back pain, no recent trauma. DERMATOLOGIC: No rash, no itching, no lesions. ENDOCRINE: No polyuria, polydipsia, no heat or cold intolerance. No recent change in weight. HEMATOLOGICAL: No anemia or easy bruising or bleeding. NEUROLOGIC: No headache, seizures, numbness, tingling or weakness. PSYCHIATRIC: No depression, no loss of interest in normal activity or change in sleep pattern.     Blood pressure 124/86, last menstrual period 11/11/2013.  Physical Exam:  HEENT:unremarkable Neck:Supple, midline, no thyroid megaly, no carotid bruits Lungs:  Clear to auscultation no rhonchi's or wheezes Heart:Regular rate and rhythm, no murmurs or gallops Breast Exam: Both breasts are symmetrical in appearance the skin discoloration no palpable mass or tenderness no supraclavicular axillary lymphadenopathy Abdomen: Soft nontender no rebound or guarding small semilunar scar noted infraumbilical region as well as Pfannenstiel scar Pelvic:BUS within normal limits Vagina: Fish like odor grade discharge Cervix: No lesions or discharge Uterus: 14 week size irregular size uterus Adnexa: Difficult to evaluate as a result of large uterus Extremities: No cords, no edema Rectal: Not examined  Results for orders placed in visit on 12/03/13 (from the past 24 hour(s))  WET PREP FOR TRICH, YEAST, CLUE     Status: Abnormal   Collection Time    12/03/13 11:27 AM      Result Value Ref Range   Yeast Wet Prep HPF POC NONE SEEN  NONE SEEN   Trich, Wet Prep NONE SEEN  NONE SEEN   Clue Cells Wet Prep HPF POC MOD (*) NONE SEEN   WBC, Wet Prep HPF POC NONE SEEN  NONE SEEN   Narrative:     Performed at:  [facility]                [place], [place] [address]    Wet prep today: Amine Pos, moderate clue cells, 2 numerous to count bacteria  Assessment/Plan: Patient with 14 week size leiomyomatous uteri contributing to dysmenorrhea, menorrhagia, bloating, pelvic pressure and anemia. Patient previously had scheduled and been counseled for surgery. She had consultation with interventional radiologist for possible uterine artery embolization but she decided not to proceed. We discussed the only other alternative would be a myomectomy but at the age of 44 the success rate would be low and would not warrant the risk of hemorrhage and possible blood transfusion. She is now decided that she does not want to have children and was to proceed with the abdominal hysterectomy. Once again literature and information was provided and the risks benefits and pros and cons of the procedure were discussed. For bacterial vaginosis she will be placed on Tindamax 500 mg 4 tablets today repeat 4 tablets tomorrow. The following risks for surgery were discussed:  Patient was counseled as to the risk of surgery to include the following:  1. Infection (prohylactic antibiotics will be administered)  2. DVT/Pulmonary Embolism (prophylactic pneumo compression stockings will be used)  3.Trauma to internal organs requiring additional surgical procedure to repair any injury to     Internal organs requiring perhaps additional hospitalization days.  4.Hemmorhage requiring transfusion and blood products which carry risks such as  anaphylactic reaction, hepatitis and AIDS  Patient had received literature information on the procedure scheduled and all her questions were answered and fully accepts all risk. A CBC will be ordered today and she is to continue taking her arm tablet one twice a day until her surgery.   Juan H FernandezMD11:[id number]  AMTD@Note : This dictation was prepared with  Dragon/digital dictation along withSmart phrase technology. Any transcriptional errors that result from this process are unintentional.       Chandra Batch Linna Caprice 12/03/2013, 11:33 AM  Note: This dictation was prepared with  Dragon/digital dictation along withSmart phrase technology. Any transcriptional errors that result from this process are unintentional.

## 2013-12-03 NOTE — Patient Instructions (Addendum)
Hysterectomy Information  A hysterectomy is a surgery in which your uterus is removed. This surgery may be done to treat various medical problems. After the surgery, you will no longer have menstrual periods. The surgery will also make you unable to become pregnant (sterile). The fallopian tubes and ovaries can be removed (bilateral salpingo-oophorectomy) during this surgery as well.  REASONS FOR A HYSTERECTOMY  Persistent, abnormal bleeding.  Lasting (chronic) pelvic pain or infection.  The lining of the uterus (endometrium) starts growing outside the uterus (endometriosis).  The endometrium starts growing in the muscle of the uterus (adenomyosis).  The uterus falls down into the vagina (pelvic organ prolapse).  Noncancerous growths in the uterus (uterine fibroids) that cause symptoms.  Precancerous cells.  Cervical cancer or uterine cancer. TYPES OF HYSTERECTOMIES  Supracervical hysterectomy In this type, the top part of the uterus is removed, but not the cervix.  Total hysterectomy The uterus and cervix are removed.  Radical hysterectomy The uterus, the cervix, and the fibrous tissue that holds the uterus in place in the pelvis (parametrium) are removed. WAYS A HYSTERECTOMY CAN BE PERFORMED  Abdominal hysterectomy A large surgical cut (incision) is made in the abdomen. The uterus is removed through this incision.  Vaginal hysterectomy An incision is made in the vagina. The uterus is removed through this incision. There are no abdominal incisions.  Conventional laparoscopic hysterectomy Three or four small incisions are made in the abdomen. A thin, lighted tube with a camera (laparoscope) is inserted into one of the incisions. Other tools are put through the other incisions. The uterus is cut into small pieces. The small pieces are removed through the incisions, or they are removed through the vagina.  Laparoscopically assisted vaginal hysterectomy (LAVH) Three or four small  incisions are made in the abdomen. Part of the surgery is performed laparoscopically and part vaginally. The uterus is removed through the vagina.  Robot-assisted laparoscopic hysterectomy A laparoscope and other tools are inserted into 3 or 4 small incisions in the abdomen. A computer-controlled device is used to give the surgeon a 3D image and to help control the surgical instruments. This allows for more precise movements of surgical instruments. The uterus is cut into small pieces and removed through the incisions or removed through the vagina. RISKS AND COMPLICATIONS  Possible complications associated with this procedure include:  Bleeding and risk of blood transfusion. Tell your health care provider if you do not want to receive any blood products.  Blood clots in the legs or lung.  Infection.  Injury to surrounding organs.  Problems or side effects related to anesthesia.  Conversion to an abdominal hysterectomy from one of the other techniques. WHAT TO EXPECT AFTER A HYSTERECTOMY  You will be given pain medicine.  You will need to have someone with you for the first 3 5 days after you go home.  You will need to follow up with your surgeon in 2 4 weeks after surgery to evaluate your progress.  You may have early menopause symptoms such as hot flashes, night sweats, and insomnia.  If you had a hysterectomy for a problem that was not cancer or not a condition that could lead to cancer, then you no longer need Pap tests. However, even if you no longer need a Pap test, a regular exam is a good idea to make sure no other problems are starting. Document Released: 02/07/2001 Document Revised: 06/04/2013 Document Reviewed: 04/21/2013 [facility] Patient Information 2014 [place]. Fibroids Fibroids are lumps (tumors)  that can occur any place in a woman's body. These lumps are not cancerous. Fibroids vary in size, weight, and where they grow. HOME CARE  Do not take aspirin.  Write  down the number of pads or tampons you use during your period. Tell your doctor. This can help determine the best treatment for you. GET HELP RIGHT AWAY IF:  You have pain in your lower belly (abdomen) that is not helped with medicine.  You have cramps that are not helped with medicine.  You have more bleeding between or during your period.  You feel lightheaded or pass out (faint).  Your lower belly pain gets worse. MAKE SURE YOU:  Understand these instructions.  Will watch your condition.  Will get help right away if you are not doing well or get worse. Document Released: 09/16/2010 Document Revised: 11/06/2011 Document Reviewed: 09/16/2010 [facility] Patient Information 2014 [place], [place]. Bacterial Vaginosis Bacterial vaginosis is a vaginal infection that occurs when the normal balance of bacteria in the vagina is disrupted. It results from an overgrowth of certain bacteria. This is the most common vaginal infection in women of childbearing age. Treatment is important to prevent complications, especially in pregnant women, as it can cause a premature delivery. CAUSES  Bacterial vaginosis is caused by an increase in harmful bacteria that are normally present in smaller amounts in the vagina. Several different kinds of bacteria can cause bacterial vaginosis. However, the reason that the condition develops is not fully understood. RISK FACTORS Certain activities or behaviors can put you at an increased risk of developing bacterial vaginosis, including:  Having a new sex partner or multiple sex partners.  Douching.  Using an intrauterine device (IUD) for contraception. Women do not get bacterial vaginosis from toilet seats, bedding, swimming pools, or contact with objects around them. SIGNS AND SYMPTOMS  Some women with bacterial vaginosis have no signs or symptoms. Common symptoms include:  Grey vaginal discharge.  A fishlike odor with discharge, especially after sexual  intercourse.  Itching or burning of the vagina and vulva.  Burning or pain with urination. DIAGNOSIS  Your health care provider will take a medical history and examine the vagina for signs of bacterial vaginosis. A sample of vaginal fluid may be taken. Your health care provider will look at this sample under a microscope to check for bacteria and abnormal cells. A vaginal pH test may also be done.  TREATMENT  Bacterial vaginosis may be treated with antibiotic medicines. These may be given in the form of a pill or a vaginal cream. A second round of antibiotics may be prescribed if the condition comes back after treatment.  HOME CARE INSTRUCTIONS   Only take over-the-counter or prescription medicines as directed by your health care provider.  If antibiotic medicine was prescribed, take it as directed. Make sure you finish it even if you start to feel better.  Do not have sex until treatment is completed.  Tell all sexual partners that you have a vaginal infection. They should see their health care provider and be treated if they have problems, such as a mild rash or itching.  Practice safe sex by using condoms and only having one sex partner. SEEK MEDICAL CARE IF:   Your symptoms are not improving after 3 days of treatment.  You have increased discharge or pain.  You have a fever. MAKE SURE YOU:   Understand these instructions.  Will watch your condition.  Will get help right away if you are not doing well  or get worse. FOR MORE INFORMATION  Centers for Disease Control and Prevention, Division of STD Prevention: [employer] American Sexual Health Association (ASHA): www.ashastd.org  Document Released: 08/14/2005 Document Revised: 06/04/2013 Document Reviewed: 03/26/2013 [facility] Patient Information 2014 [place].

## 2013-12-03 NOTE — Addendum Note (Signed)
Addended by: Terrance Mass on: 12/03/2013 11:50 AM   Modules accepted: Orders

## 2013-12-04 ENCOUNTER — Telehealth: Payer: Self-pay

## 2013-12-04 NOTE — Telephone Encounter (Signed)
Patient informed that TAH scheduled for 01/07/14 1:00pm.  We reviewed ins benefits and her estimated financial responsibility to [facility] due by week before surgery.  She will expect call from [facility] to arrange pre op appt.

## 2013-12-15 ENCOUNTER — Inpatient Hospital Stay (HOSPITAL_COMMUNITY): Admission: RE | Admit: 2013-12-15 | Payer: BC Managed Care – PPO | Source: Ambulatory Visit | Admitting: Gynecology

## 2013-12-15 ENCOUNTER — Encounter (HOSPITAL_COMMUNITY): Admission: RE | Payer: Self-pay | Source: Ambulatory Visit

## 2013-12-15 SURGERY — HYSTERECTOMY, ABDOMINAL
Anesthesia: General

## 2013-12-24 ENCOUNTER — Encounter (HOSPITAL_COMMUNITY): Payer: Self-pay

## 2013-12-26 ENCOUNTER — Telehealth: Payer: Self-pay | Admitting: *Deleted

## 2013-12-26 NOTE — Telephone Encounter (Signed)
You can call and Megace 40 mg twice a day for 10 days to cut down her bleeding. For her cramps call in  Ultram 50 mg one by mouth every 6 hours when necessary #30

## 2013-12-26 NOTE — Telephone Encounter (Signed)
Pt scheduled for surgery on 01/07/14 for TAH c/o painful fibroids, passed some blood clots when cycle started last Tuesday. Pt said stomach looks very big feels bloated. Please advise

## 2013-12-26 NOTE — Telephone Encounter (Signed)
Pt declined to take medication because she does not like taking medication. Pt said she will be okay and does not want any medication.

## 2013-12-30 NOTE — Patient Instructions (Addendum)
   Your procedure is scheduled on:  Wednesday, May 13  Enter through the [employer] [facility] at:  [place] up the phone at the desk and dial [phone] and inform us of your arrival.  Please call this number if you have any problems the morning of surgery: [phone]  Remember: Do not eat food after midnight: Tuesday Do not drink clear liquids after:  9 AM Wednesday, day of surgery Take these medicines the morning of surgery with a SIP OF WATER:  None  Do not wear jewelry, make-up, or FINGER nail polish No metal in your hair or on your body. Do not wear lotions, powders, perfumes.  You may wear deodorant.  Do not bring valuables to the hospital. Contacts, dentures or bridgework may not be worn into surgery.  Leave suitcase in the car. After Surgery it may be brought to your room. For patients being admitted to the hospital, checkout time is 11:00am the day of discharge.  Home with parents Mr/Ms Katelyn [name].

## 2013-12-31 ENCOUNTER — Encounter (HOSPITAL_COMMUNITY): Payer: Self-pay

## 2013-12-31 ENCOUNTER — Other Ambulatory Visit: Payer: Self-pay

## 2013-12-31 ENCOUNTER — Encounter (HOSPITAL_COMMUNITY)
Admission: RE | Admit: 2013-12-31 | Discharge: 2013-12-31 | Disposition: A | Discharge status: Home / Self Care | Payer: BC Managed Care – PPO | Source: Ambulatory Visit | Attending: Gynecology | Admitting: Gynecology

## 2013-12-31 DIAGNOSIS — Z01812 Encounter for preprocedural laboratory examination: Secondary | ICD-10-CM | POA: Insufficient documentation

## 2013-12-31 LAB — URINE MICROSCOPIC-ADD ON

## 2013-12-31 LAB — CBC
HCT: 35.8 % — ABNORMAL LOW (ref 36.0–46.0)
HEMOGLOBIN: 11.8 g/dL — AB (ref 12.0–15.0)
MCH: 30.2 pg (ref 26.0–34.0)
MCHC: 33 g/dL (ref 30.0–36.0)
MCV: 91.6 fL (ref 78.0–100.0)
Platelets: 293 10*3/uL (ref 150–400)
RBC: 3.91 MIL/uL (ref 3.87–5.11)
RDW: 14.7 % (ref 11.5–15.5)
WBC: 6.1 10*3/uL (ref 4.0–10.5)

## 2013-12-31 LAB — URINALYSIS, ROUTINE W REFLEX MICROSCOPIC
Bilirubin Urine: NEGATIVE
GLUCOSE, UA: NEGATIVE mg/dL
KETONES UR: NEGATIVE mg/dL
LEUKOCYTES UA: NEGATIVE
Nitrite: NEGATIVE
PH: 6 (ref 5.0–8.0)
Protein, ur: NEGATIVE mg/dL
Specific Gravity, Urine: 1.01 (ref 1.005–1.030)
Urobilinogen, UA: 0.2 mg/dL (ref 0.0–1.0)

## 2014-01-05 NOTE — H&P (Signed)
Katelyn [name] is an 38 y.o. female. For preoperative exam. Patient had previously been scheduled for total abdominal hysterectomy with bilateral salpingectomy and ovarian conservation but canceled. Patient with enlarging leiomyomatous uteri contributing to dysmenorrhea and menorrhagia and bloating. Patient was having a slight vaginal discharge. Patient had been treated in the past for bacterial vaginosis. And recently had a full STD screening which was negative. Patient had been placed on Lupron 11.25 mg on January 13 and was instructed to return to the office today for preoperative exam and rescheduling for her surgery as well as to look at her ultrasound.   Ultrasound in 2014:  a large uterus with multiple fibroids. The largest one measures 6.5 x 3.8 cm. Approximately 8 fibroids have been identified. There appeared to be one submucosal with the description of having a dimension of 4 cm. Patient's ovaries were reported to be normal. Endometrial echo was enlarged at 23 mm. The sono infusion histogram confirmed a large submucous myoma which was described as filling the endometrial cavity with the addition of 2 small endometrial polyps.   An endometrial biopsy was done at that time with the following results:  Endometrium, biopsy  - SCANT GLANDULAR FRAGMENTS ADMIXED WITH ABUNDANT BLOOD AND MUCUS.  - SEE MICROSCOPIC DESCRIPTION.  Microscopic Comment  The specimen consists mostly of blood and mucus with scant admixed glandular cells most of which have  features consistent with endocervical origin. There is a rare fragment which may represent endometrium with  papillary syncytial metaplasia. This fragment is minute which hampers definitive evaluation. The amount of  glandular tissue present is limited.   Patient stated that back in 1993 when she was younger she had a laparoscopy and had a large left ovarian cyst that was drained. She also had a previous cesarean section at 35 weeks and her child died at  the age of 44. Patient has had a history of anemia for which she's currently taking to her tablets daily.   Ultrasound today:  Uterus measured 14 x 13 x 10 cm with endometrial stripe of 4.7 mm. A large uterus with multiple subserosal and intramural myomas totaling 6 with the largest one measuring 63 x 47 mm. Right ovary normal left ovary thin wall cyst measuring 14 x 17 mm with internal low level echoes no fluid in the cul-de-sac. Fibroid noted to be displacing the uterus cavity.   Pertinent Gynecological History:  Menses: Irregular  Bleeding: dysfunctional uterine bleeding  Contraception: condoms  DES exposure: denies  Blood transfusions: none  Sexually transmitted diseases: no past history  Previous GYN Procedures: Laparoscopy and cesarean section  Last mammogram: normal Date: 2010  Last pap: normal Date: 2014  OB History: G 1, P 1   Menstrual History:  Menarche age: 41  Patient's last menstrual period was 11/11/2013.  Past Medical History   Diagnosis  Date   .  Anemia    .  Hx gestational diabetes    .  Fibroid     Past Surgical History   Procedure  Laterality  Date   .  Cesarean section     .  Pelvic laparoscopy   1993    Family History   Problem  Relation  Age of Onset   .  Cancer  Maternal Aunt      ovarian and uterine   .  Breast cancer  Maternal Aunt      unsure of age   .  Diabetes  Maternal Grandmother    .  Hypertension  Maternal Grandmother    Social History: reports that she has quit smoking. She has never used smokeless tobacco. She reports that she drinks alcohol. She reports that she does not use illicit drugs.  Allergies:  Allergies   Allergen  Reactions   .  Latex  Other (See Comments)     Burning with discharge with condom use   (Not in a hospital admission)   REVIEW OF SYSTEMS: A ROS was performed and pertinent positives and negatives are included in the history.  GENERAL: No fevers or chills. HEENT: No change in vision, no earache, sore throat or  sinus congestion. NECK: No pain or stiffness. CARDIOVASCULAR: No chest pain or pressure. No palpitations. PULMONARY: No shortness of breath, cough or wheeze. GASTROINTESTINAL: No abdominal pain, nausea, vomiting or diarrhea, melena or bright red blood per rectum. GENITOURINARY: No urinary frequency, urgency, hesitancy or dysuria. MUSCULOSKELETAL: No joint or muscle pain, no back pain, no recent trauma. DERMATOLOGIC: No rash, no itching, no lesions. ENDOCRINE: No polyuria, polydipsia, no heat or cold intolerance. No recent change in weight. HEMATOLOGICAL: No anemia or easy bruising or bleeding. NEUROLOGIC: No headache, seizures, numbness, tingling or weakness. PSYCHIATRIC: No depression, no loss of interest in normal activity or change in sleep pattern.     Physical Exam:  HEENT:unremarkable  Neck:Supple, midline, no thyroid megaly, no carotid bruits  Lungs: Clear to auscultation no rhonchi's or wheezes  Heart:Regular rate and rhythm, no murmurs or gallops  Breast Exam: Both breasts are symmetrical in appearance the skin discoloration no palpable mass or tenderness no supraclavicular axillary lymphadenopathy  Abdomen: Soft nontender no rebound or guarding small semilunar scar noted infraumbilical region as well as Pfannenstiel scar  Pelvic:BUS within normal limits  Vagina: Fish like odor grade discharge  Cervix: No lesions or discharge  Uterus: 14 week size irregular size uterus  Adnexa: Difficult to evaluate as a result of large uterus  Extremities: No cords, no edema  Rectal: Not examined   Results for orders placed in visit on 12/03/13 (from the past 24 hour(s))   WET PREP FOR TRICH, YEAST, CLUE Status: Abnormal    Collection Time    12/03/13 11:27 AM   Result  Value  Ref Range    Yeast Wet Prep HPF POC  NONE SEEN  NONE SEEN    Trich, Wet Prep  NONE SEEN  NONE SEEN    Clue Cells Wet Prep HPF POC  MOD (*)  NONE SEEN    WBC, Wet Prep HPF POC  NONE SEEN  NONE SEEN    Narrative:     Performed at: [facility]  [place], [place] [address]   Wet prep today: Amine Pos, moderate clue cells, 2 numerous to count bacteria   Assessment/Plan:  Patient with 14 week size leiomyomatous uteri contributing to dysmenorrhea, menorrhagia, bloating, pelvic pressure and anemia. Patient previously had scheduled and been counseled for surgery. She had consultation with interventional radiologist for possible uterine artery embolization but she decided not to proceed. We discussed the only other alternative would be a myomectomy but at the age of 3 the success rate would be low and would not warrant the risk of hemorrhage and possible blood transfusion. She is now decided that she does not want to have children and was to proceed with the abdominal hysterectomy. Once again literature and information was provided and the risks benefits and pros and cons of the procedure were discussed. For bacterial vaginosis  she will be placed on Tindamax 500 mg 4 tablets today repeat 4 tablets tomorrow. The following risks for surgery were discussed:   Patient was counseled as to the risk of surgery to include the following:  1. Infection (prohylactic antibiotics will be administered)  2. DVT/Pulmonary Embolism (prophylactic pneumo compression stockings will be used)  3.Trauma to internal organs requiring additional surgical procedure to repair any injury to  Internal organs requiring perhaps additional hospitalization days.  4.Hemmorhage requiring transfusion and blood products which carry risks such as anaphylactic reaction, hepatitis and AIDS  Patient had received literature information on the procedure scheduled and all her questions were answered and fully accepts all risk. A CBC will be ordered today and she is to continue taking her arm tablet one twice a day until her surgery.   Juan H FernandezMD11:46 AMTD@Note : This dictation was prepared with Dragon/digital  dictation along withSmart phrase technology. Any transcriptional errors that result from this process are unintentional.

## 2014-01-06 ENCOUNTER — Telehealth: Payer: Self-pay

## 2014-01-06 MED ORDER — CEFOTETAN DISODIUM 2 G IJ SOLR
2.0000 g | INTRAMUSCULAR | Status: AC
  Administered 2014-01-07: 2 g via INTRAVENOUS
  Filled 2014-01-06: qty 2

## 2014-01-06 NOTE — Telephone Encounter (Signed)
Her surgery is in the morning. Too late for her to abstain from alcohol for 72 hours.  I can tell her not to have her wine at dinner. Please advise.

## 2014-01-06 NOTE — Telephone Encounter (Signed)
OK no wine or alcohol tonight

## 2014-01-06 NOTE — Telephone Encounter (Signed)
Patient called concerned because her sister told her she should not be drinking alcohol preoperatively as it can thin her blood. She is speaking of a glass of wine with dinner.  I reassured her that I had never heard this and I thought a glass of wine with dinner would be fine. Reminded her NPO after MN.

## 2014-01-06 NOTE — Telephone Encounter (Signed)
Patient informed. 

## 2014-01-06 NOTE — Telephone Encounter (Signed)
Stop all alcohol 72 hours prior to surgery

## 2014-01-07 ENCOUNTER — Inpatient Hospital Stay (HOSPITAL_COMMUNITY)
Admission: RE | Admit: 2014-01-07 | Discharge: 2014-01-09 | DRG: 743 | Disposition: A | Discharge status: Home / Self Care | Payer: BC Managed Care – PPO | Source: Ambulatory Visit | Attending: Gynecology | Admitting: Gynecology

## 2014-01-07 ENCOUNTER — Encounter (HOSPITAL_COMMUNITY): Payer: BC Managed Care – PPO | Admitting: Anesthesiology

## 2014-01-07 ENCOUNTER — Encounter (HOSPITAL_COMMUNITY)
Admission: RE | Disposition: A | Discharge status: Home / Self Care | Payer: Self-pay | Source: Ambulatory Visit | Attending: Gynecology

## 2014-01-07 ENCOUNTER — Encounter (HOSPITAL_COMMUNITY): Payer: Self-pay

## 2014-01-07 ENCOUNTER — Inpatient Hospital Stay (HOSPITAL_COMMUNITY): Payer: BC Managed Care – PPO | Admitting: Anesthesiology

## 2014-01-07 DIAGNOSIS — N920 Excessive and frequent menstruation with regular cycle: Secondary | ICD-10-CM | POA: Diagnosis present

## 2014-01-07 DIAGNOSIS — Z6832 Body mass index (BMI) 32.0-32.9, adult: Secondary | ICD-10-CM

## 2014-01-07 DIAGNOSIS — Z87891 Personal history of nicotine dependence: Secondary | ICD-10-CM

## 2014-01-07 DIAGNOSIS — E669 Obesity, unspecified: Secondary | ICD-10-CM | POA: Diagnosis present

## 2014-01-07 DIAGNOSIS — D649 Anemia, unspecified: Secondary | ICD-10-CM | POA: Diagnosis present

## 2014-01-07 DIAGNOSIS — Z9889 Other specified postprocedural states: Secondary | ICD-10-CM

## 2014-01-07 DIAGNOSIS — N840 Polyp of corpus uteri: Secondary | ICD-10-CM | POA: Diagnosis present

## 2014-01-07 DIAGNOSIS — N949 Unspecified condition associated with female genital organs and menstrual cycle: Secondary | ICD-10-CM | POA: Diagnosis present

## 2014-01-07 DIAGNOSIS — N946 Dysmenorrhea, unspecified: Secondary | ICD-10-CM

## 2014-01-07 DIAGNOSIS — D259 Leiomyoma of uterus, unspecified: Secondary | ICD-10-CM

## 2014-01-07 DIAGNOSIS — D252 Subserosal leiomyoma of uterus: Secondary | ICD-10-CM | POA: Diagnosis present

## 2014-01-07 HISTORY — DX: Other specified health status: Z78.9

## 2014-01-07 HISTORY — PX: ABDOMINAL HYSTERECTOMY: SHX81

## 2014-01-07 LAB — PREGNANCY, URINE: PREG TEST UR: NEGATIVE

## 2014-01-07 SURGERY — HYSTERECTOMY, ABDOMINAL
Anesthesia: General

## 2014-01-07 MED ORDER — VASOPRESSIN 20 UNIT/ML IJ SOLN
INTRAMUSCULAR | Status: AC
  Filled 2014-01-07: qty 1

## 2014-01-07 MED ORDER — ROCURONIUM BROMIDE 100 MG/10ML IV SOLN
INTRAVENOUS | Status: DC | PRN
  Administered 2014-01-07: 50 mg via INTRAVENOUS

## 2014-01-07 MED ORDER — ONDANSETRON HCL 4 MG/2ML IJ SOLN
INTRAMUSCULAR | Status: AC
  Filled 2014-01-07: qty 2

## 2014-01-07 MED ORDER — LACTATED RINGERS IV SOLN
INTRAVENOUS | Status: DC
  Administered 2014-01-07 – 2014-01-08 (×2): via INTRAVENOUS

## 2014-01-07 MED ORDER — FENTANYL CITRATE 0.05 MG/ML IJ SOLN
INTRAMUSCULAR | Status: AC
  Filled 2014-01-07: qty 2

## 2014-01-07 MED ORDER — SODIUM CHLORIDE 0.9 % IJ SOLN
INTRAMUSCULAR | Status: AC
  Filled 2014-01-07: qty 30

## 2014-01-07 MED ORDER — 0.9 % SODIUM CHLORIDE (POUR BTL) OPTIME
TOPICAL | Status: DC | PRN
  Administered 2014-01-07: 3000 mL

## 2014-01-07 MED ORDER — BUPIVACAINE LIPOSOME 1.3 % IJ SUSP
20.0000 mL | Freq: Once | INTRAMUSCULAR | Status: AC
  Administered 2014-01-07: 20 mL
  Filled 2014-01-07: qty 20

## 2014-01-07 MED ORDER — MEPERIDINE HCL 25 MG/ML IJ SOLN: 6.2500 mg | INTRAMUSCULAR | Status: DC | PRN

## 2014-01-07 MED ORDER — LIDOCAINE HCL (CARDIAC) 20 MG/ML IV SOLN
INTRAVENOUS | Status: AC
  Filled 2014-01-07: qty 5

## 2014-01-07 MED ORDER — MORPHINE SULFATE (PF) 1 MG/ML IV SOLN
INTRAVENOUS | Status: DC
  Administered 2014-01-07: 18:00:00 via INTRAVENOUS
  Administered 2014-01-07: 5 mg via INTRAVENOUS
  Administered 2014-01-08: 3 mg via INTRAVENOUS
  Administered 2014-01-08: 2 mg via INTRAVENOUS
  Filled 2014-01-07: qty 25

## 2014-01-07 MED ORDER — KETOROLAC TROMETHAMINE 30 MG/ML IJ SOLN: 15.0000 mg | Freq: Once | INTRAMUSCULAR | Status: DC | PRN

## 2014-01-07 MED ORDER — DIPHENHYDRAMINE HCL 50 MG/ML IJ SOLN: 12.5000 mg | Freq: Four times a day (QID) | INTRAMUSCULAR | Status: DC | PRN

## 2014-01-07 MED ORDER — DIPHENHYDRAMINE HCL 12.5 MG/5ML PO ELIX: 12.5000 mg | ORAL_SOLUTION | Freq: Four times a day (QID) | ORAL | Status: DC | PRN

## 2014-01-07 MED ORDER — ONDANSETRON HCL 4 MG/2ML IJ SOLN: 4.0000 mg | Freq: Four times a day (QID) | INTRAMUSCULAR | Status: DC | PRN

## 2014-01-07 MED ORDER — GLYCOPYRROLATE 0.2 MG/ML IJ SOLN
INTRAMUSCULAR | Status: AC
  Filled 2014-01-07: qty 3

## 2014-01-07 MED ORDER — GLYCOPYRROLATE 0.2 MG/ML IJ SOLN
INTRAMUSCULAR | Status: DC | PRN
  Administered 2014-01-07: 0.6 mg via INTRAVENOUS

## 2014-01-07 MED ORDER — ACETAMINOPHEN 160 MG/5ML PO SOLN
ORAL | Status: AC
  Filled 2014-01-07: qty 40.6

## 2014-01-07 MED ORDER — LIDOCAINE HCL (CARDIAC) 20 MG/ML IV SOLN
INTRAVENOUS | Status: DC | PRN
  Administered 2014-01-07: 70 mg via INTRAVENOUS
  Administered 2014-01-07: 30 mg via INTRAVENOUS

## 2014-01-07 MED ORDER — SODIUM CHLORIDE 0.9 % IJ SOLN
9.0000 mL | INTRAMUSCULAR | Status: DC | PRN
  Administered 2014-01-08: 3 mL via INTRAVENOUS

## 2014-01-07 MED ORDER — LACTATED RINGERS IV SOLN
INTRAVENOUS | Status: DC
  Administered 2014-01-07 (×3): via INTRAVENOUS

## 2014-01-07 MED ORDER — MIDAZOLAM HCL 2 MG/2ML IJ SOLN
INTRAMUSCULAR | Status: DC | PRN
  Administered 2014-01-07: 1 mg via INTRAVENOUS

## 2014-01-07 MED ORDER — HYDROMORPHONE HCL PF 1 MG/ML IJ SOLN
0.2500 mg | INTRAMUSCULAR | Status: DC | PRN
  Administered 2014-01-07 (×3): 0.5 mg via INTRAVENOUS

## 2014-01-07 MED ORDER — ACETAMINOPHEN 160 MG/5ML PO SOLN
975.0000 mg | Freq: Once | ORAL | Status: AC
  Administered 2014-01-07: 975 mg via ORAL

## 2014-01-07 MED ORDER — PROPOFOL 10 MG/ML IV EMUL
INTRAVENOUS | Status: AC
  Filled 2014-01-07: qty 20

## 2014-01-07 MED ORDER — ROCURONIUM BROMIDE 100 MG/10ML IV SOLN
INTRAVENOUS | Status: AC
  Filled 2014-01-07: qty 1

## 2014-01-07 MED ORDER — KETOROLAC TROMETHAMINE 30 MG/ML IJ SOLN
INTRAMUSCULAR | Status: AC
  Filled 2014-01-07: qty 1

## 2014-01-07 MED ORDER — METOCLOPRAMIDE HCL 5 MG/ML IJ SOLN: 10.0000 mg | Freq: Once | INTRAMUSCULAR | Status: DC | PRN

## 2014-01-07 MED ORDER — PROPOFOL 10 MG/ML IV BOLUS
INTRAVENOUS | Status: DC | PRN
Start: 2014-01-07 — End: 2014-01-07
  Administered 2014-01-07: 180 mg via INTRAVENOUS

## 2014-01-07 MED ORDER — MIDAZOLAM HCL 2 MG/2ML IJ SOLN
INTRAMUSCULAR | Status: AC
Start: 2014-01-07 — End: 2014-01-07
  Filled 2014-01-07: qty 2

## 2014-01-07 MED ORDER — NALOXONE HCL 0.4 MG/ML IJ SOLN: 0.4000 mg | INTRAMUSCULAR | Status: DC | PRN

## 2014-01-07 MED ORDER — HYDROMORPHONE HCL PF 1 MG/ML IJ SOLN
INTRAMUSCULAR | Status: AC
  Filled 2014-01-07: qty 1

## 2014-01-07 MED ORDER — FENTANYL CITRATE 0.05 MG/ML IJ SOLN
INTRAMUSCULAR | Status: DC | PRN
  Administered 2014-01-07 (×3): 50 ug via INTRAVENOUS
  Administered 2014-01-07: 100 ug via INTRAVENOUS
  Administered 2014-01-07 (×3): 50 ug via INTRAVENOUS

## 2014-01-07 MED ORDER — FENTANYL CITRATE 0.05 MG/ML IJ SOLN
INTRAMUSCULAR | Status: AC
  Filled 2014-01-07: qty 5

## 2014-01-07 MED ORDER — DEXAMETHASONE SODIUM PHOSPHATE 10 MG/ML IJ SOLN
INTRAMUSCULAR | Status: DC | PRN
  Administered 2014-01-07: 10 mg via INTRAVENOUS

## 2014-01-07 MED ORDER — NEOSTIGMINE METHYLSULFATE 10 MG/10ML IV SOLN
INTRAVENOUS | Status: AC
  Filled 2014-01-07: qty 1

## 2014-01-07 MED ORDER — NEOSTIGMINE METHYLSULFATE 10 MG/10ML IV SOLN
INTRAVENOUS | Status: DC | PRN
  Administered 2014-01-07: 3 mg via INTRAVENOUS

## 2014-01-07 MED ORDER — ONDANSETRON HCL 4 MG/2ML IJ SOLN
INTRAMUSCULAR | Status: DC | PRN
  Administered 2014-01-07: 4 mg via INTRAVENOUS

## 2014-01-07 MED ORDER — DEXAMETHASONE SODIUM PHOSPHATE 10 MG/ML IJ SOLN
INTRAMUSCULAR | Status: AC
Start: 2014-01-07 — End: 2014-01-07
  Filled 2014-01-07: qty 1

## 2014-01-07 SURGICAL SUPPLY — 53 items
BARRIER ADHS 3X4 INTERCEED (GAUZE/BANDAGES/DRESSINGS) IMPLANT
BRR ADH 4X3 ABS CNTRL BYND (GAUZE/BANDAGES/DRESSINGS)
CANISTER SUCT 3000ML (MISCELLANEOUS) ×3 IMPLANT
CATH FOLEY LATEX FREE 14FR (CATHETERS) ×3
CATH FOLEY LF 14FR (CATHETERS) IMPLANT
CLOSURE WOUND 1/2 X4 (GAUZE/BANDAGES/DRESSINGS)
CLOSURE WOUND 1/4X4 (GAUZE/BANDAGES/DRESSINGS) ×1
CLOTH BEACON ORANGE TIMEOUT ST (SAFETY) ×3 IMPLANT
DECANTER SPIKE VIAL GLASS SM (MISCELLANEOUS) IMPLANT
DRAPE CESAREAN BIRTH W POUCH (DRAPES) ×3 IMPLANT
DRAPE WARM FLUID 44X44 (DRAPE) IMPLANT
DRSG OPSITE POSTOP 4X10 (GAUZE/BANDAGES/DRESSINGS) ×2 IMPLANT
DRSG TEGADERM 2.38X2.75 (GAUZE/BANDAGES/DRESSINGS) IMPLANT
DRSG TELFA 3X8 NADH (GAUZE/BANDAGES/DRESSINGS) ×3 IMPLANT
DRSG XEROFORM 1X8 (GAUZE/BANDAGES/DRESSINGS) ×3 IMPLANT
GLOVE BIOGEL PI IND STRL 8 (GLOVE) ×1 IMPLANT
GLOVE BIOGEL PI INDICATOR 8 (GLOVE) ×2
GLOVE ECLIPSE 7.5 STRL STRAW (GLOVE) ×6 IMPLANT
GOWN STRL REUS W/TWL LRG LVL3 (GOWN DISPOSABLE) ×9 IMPLANT
HEMOSTAT SURGICEL 2X3 (HEMOSTASIS) ×6 IMPLANT
NEEDLE HYPO 25X1 1.5 SAFETY (NEEDLE) IMPLANT
NS IRRIG 1000ML POUR BTL (IV SOLUTION) ×7 IMPLANT
PACK ABDOMINAL GYN (CUSTOM PROCEDURE TRAY) ×3 IMPLANT
PAD DRESSING TELFA 3X8 NADH (GAUZE/BANDAGES/DRESSINGS) IMPLANT
PAD OB MATERNITY 4.3X12.25 (PERSONAL CARE ITEMS) ×3 IMPLANT
PROTECTOR NERVE ULNAR (MISCELLANEOUS) ×3 IMPLANT
RETRACTOR WND ALEXIS 25 LRG (MISCELLANEOUS) ×1 IMPLANT
RTRCTR WOUND ALEXIS 25CM LRG (MISCELLANEOUS) ×3
SPONGE GAUZE 4X4 12PLY STER LF (GAUZE/BANDAGES/DRESSINGS) ×6 IMPLANT
SPONGE LAP 18X18 X RAY DECT (DISPOSABLE) ×6 IMPLANT
STAPLER VISISTAT 35W (STAPLE) ×3 IMPLANT
STRIP CLOSURE SKIN 1/2X4 (GAUZE/BANDAGES/DRESSINGS) IMPLANT
STRIP CLOSURE SKIN 1/4X4 (GAUZE/BANDAGES/DRESSINGS) ×2 IMPLANT
SUT CHROMIC 3 0 SH 27 (SUTURE) IMPLANT
SUT VIC AB 0 CT1 18XCR BRD8 (SUTURE) ×3 IMPLANT
SUT VIC AB 0 CT1 27 (SUTURE) ×3
SUT VIC AB 0 CT1 27XBRD ANBCTR (SUTURE) IMPLANT
SUT VIC AB 0 CT1 36 (SUTURE) ×6 IMPLANT
SUT VIC AB 0 CT1 8-18 (SUTURE) ×9
SUT VIC AB 1 CT1 18XBRD ANBCTR (SUTURE) IMPLANT
SUT VIC AB 1 CT1 8-18 (SUTURE)
SUT VIC AB 3-0 CT1 27 (SUTURE) ×6
SUT VIC AB 3-0 CT1 TAPERPNT 27 (SUTURE) ×2 IMPLANT
SUT VIC AB 3-0 SH 27 (SUTURE) ×3
SUT VIC AB 3-0 SH 27X BRD (SUTURE) ×1 IMPLANT
SUT VIC AB 4-0 KS 27 (SUTURE) ×2 IMPLANT
SUT VICRYL 0 TIES 12 18 (SUTURE) ×3 IMPLANT
SUT VICRYL 3 0 BR 18  UND (SUTURE)
SUT VICRYL 3 0 BR 18 UND (SUTURE) IMPLANT
SYR CONTROL 10ML LL (SYRINGE) IMPLANT
TOWEL OR 17X24 6PK STRL BLUE (TOWEL DISPOSABLE) ×6 IMPLANT
TRAY FOLEY CATH 14FR (SET/KITS/TRAYS/PACK) ×3 IMPLANT
WATER STERILE IRR 1000ML POUR (IV SOLUTION) ×3 IMPLANT

## 2014-01-07 NOTE — Anesthesia Preprocedure Evaluation (Addendum)
Anesthesia Evaluation  Patient identified by MRN, date of birth, ID band Patient awake    Reviewed: Allergy & Precautions, H&P , NPO status , Patient's Chart, lab work & pertinent test results, reviewed documented beta blocker date and time   History of Anesthesia Complications Negative for: history of anesthetic complications  Airway Mallampati: III TM Distance: >3 FB Neck ROM: full    Dental  (+) Teeth Intact   Pulmonary neg pulmonary ROS, former smoker,  breath sounds clear to auscultation  Pulmonary exam normal       Cardiovascular negative cardio ROS  Rhythm:regular Rate:Normal     Neuro/Psych negative neurological ROS  negative psych ROS   GI/Hepatic negative GI ROS, Neg liver ROS,   Endo/Other  Obese - BMI 32  Renal/GU negative Renal ROS  Female GU complaint     Musculoskeletal   Abdominal   Peds  Hematology  (+) anemia ,   Anesthesia Other Findings   Reproductive/Obstetrics negative OB ROS                          Anesthesia Physical Anesthesia Plan  ASA: II  Anesthesia Plan: General ETT   Post-op Pain Management:    Induction:   Airway Management Planned:   Additional Equipment:   Intra-op Plan:   Post-operative Plan:   Informed Consent: I have reviewed the patients History and Physical, chart, labs and discussed the procedure including the risks, benefits and alternatives for the proposed anesthesia with the patient or authorized representative who has indicated his/her understanding and acceptance.   Dental Advisory Given  Plan Discussed with: CRNA and Surgeon  Anesthesia Plan Comments:         Anesthesia Quick Evaluation

## 2014-01-07 NOTE — Interval H&P Note (Signed)
History and Physical Interval Note:  01/07/2014 12:55 PM  Katelyn [name]  has presented today for surgery, with the diagnosis of fibroids  The various methods of treatment have been discussed with the patient and family. After consideration of risks, benefits and other options for treatment, the patient has consented to  Procedure(s) with comments: HYSTERECTOMY ABDOMINAL WITH BILATERAL SALPINGECTOMY (N/A) - Dr. Phineas Real to assist.  2nd choice will be 1:00pm on 01/07/14. Thanks! as a surgical intervention .  The patient's history has been reviewed, patient examined, no change in status, stable for surgery.  I have reviewed the patient's chart and labs.  Questions were answered to the patient's satisfaction.     Terrance Mass

## 2014-01-07 NOTE — Op Note (Signed)
Operative Note  01/07/2014  3:21 PM  PATIENT:  Katelyn [name]  38 y.o. female  PRE-OPERATIVE DIAGNOSIS:  fibroids, dysmenorrhea, menorrhagia, anemia POST-OPERATIVE DIAGNOSIS:  Fibroids, dysmenorrhea, menorrhagia, anemia  PROCEDURE:  Procedure(s): HYSTERECTOMY ABDOMINAL WITH BILATERAL SALPINGECTOMY  SURGEON:  Surgeon(s): Terrance Mass, MD Donalynn Furlong M.D. assist  ANESTHESIA:   general  FINDINGS: 14 week size leiomyomatous uteri normal fallopian tubes and ovaries  DESCRIPTION OF OPERATION:The patient was taken to the operating room and was placed in a dorsal supine position. She was then placed under general anesthesia and intubated. A time out was undertaken to correctly identify the patient and procedure to be undertaken. She was then examined under anesthesia with notation of a 12-to 14 week sized uterus.The procedure was begun by performing a Pfannenstiel skin Incision with the first knife. The incision was extended to the level of the fascia using Bovie coagulation. The fascia was scored bilaterally at the midline. The fascial incision was then extended in a curvilinear fashion using the Mayo scissors. The fascia was dissected from the muscular area below by both sharp and blunt dissection. The muscular area was dissected along the midline by both sharp and blunt dissection. The peritoneum was subsequently entered. The incision was then extended in a vertical fashion using Metzenbaum scissors. Immediate notation was made of a large uterine fibroid the uterus was then delivered through the incision. The right And left ovary were within normal limits.A self-retaining O'Connor-O'Sullivan retractor was placed into the abdomen. The abdominal contents were packed in the upper abdomen using 2 clean wet laps. Two large Kelly clamps were placed above the cornual portion of the uterus, and the hysterectomy was begun by transecting the round ligaments, which were subsequently suture ligated  with 0 Vicryl suture and tag. Uterovesical peritoneum was subsequently incised with the Metzenbaum scissors, and incision was extended to the sidewall on both side allowing dissection to the sidewall and subsequent visualization of the uterus. Once the uterus was felt free, the infundibulopelvic ligaments were then grasped with 2 curved Haney clamps, subsequently cut and the pedicle was initially free tied with 0 Vicryl and subsequently suture ligated with 0 Vicryl suture leaving both tubes and ovaries behind. Attention was then turned to the round ligament, which was also secured in the previous fashion. The uterine vessels were subsequently skeletonized, subsequently grasped with curved Heaney clamps, cut and suture ligated with 0 Vicryl suture. Cardinal ligaments on either side were subsequently grasped, cut and suture ligated with 0 Vicryl suture to the level of the uterosacral ligaments, which were then grasped with curved Haney clamps, cut and suture ligated with 0 Vicryl suture. These sutures were left long and tagged with hemostats. Subsequently, 2 large clamps were placed about the upper vaginal cuff. The specimen was subsequently dissected free with the Jorgenson scissors, and the pedicles were suture ligated with 0 Vicryl suture. The vaginal cuff was also closed with 0 Vicryl suture in an interrupted fashion. Subsequently, the pelvis was irrigated until clear. Points of bleeding were secured either by Bovie coagulation or by suture ligature with a 0 Vicryl suture. Once hemostasis was achieved surgical cell was placed over the vaginal cuff and pedicle area. Of note prior to doing this both fallopian tubes were transected and passed off the operative field and the remaining stump was secured with 0 Vicryl suture. the O'Connor-O'Sullivan retractor was subsequently removed. Laps were subsequently removed and the abdominal wall was closed in the following fashion. The fascial layers were approximated  with 0  Vicryl with sutures coming from the closing angles interlocking every third towards the midline. The subcutaneous layer was reapproximated with 3-0 Vicryl suture. The skin edges were with a subcuticular stitch of Vicryl suture with a Lanny Hurst needle.. For postoperative analgesia Exparel 1.3% was infiltrated at the level of the peritoneum, fascia and subcutaneous for a total of  17 cc.The patient tolerated the procedure well. The uterus and cervix  and both fallopian tubes  were passed off the operative field for histological evaluation. The patient was extubated and transferred to recovery room stable vital signs. Urine output was 200 cc and clear.     ESTIMATED BLOOD LOSS: 250 cc  Intake/Output Summary (Last 24 hours) at 01/07/14 1521 Last data filed at 01/07/14 1433  Gross per 24 hour  Intake   1700 ml  Output    450 ml  Net   1250 ml     BLOOD ADMINISTERED:none   LOCAL MEDICATIONS USED:  OTHER Exparel 1.3% total of 17 cc   SPECIMEN:  Source of Specimen:  Uterus, cervix and bilateral fallopian tubes  DISPOSITION OF SPECIMEN:  PATHOLOGY  COUNTS:  YES  PLAN OF CARE: Transfer to PACU  Starlyn Skeans FernandezMD3:21 PMTD@  Note: This dictation was prepared with  Dragon/digital dictation along withSmart phrase technology. Any transcriptional errors that result from this process are unintentional.

## 2014-01-07 NOTE — Interval H&P Note (Signed)
History and Physical Interval Note:  01/07/2014 12:50 PM  Katelyn [name]  has presented today for surgery, with the diagnosis of fibroids  The various methods of treatment have been discussed with the patient and family. After consideration of risks, benefits and other options for treatment, the patient has consented to  Procedure(s) with comments: HYSTERECTOMY ABDOMINAL WITH BILATERAL SALPINGECTOMY (N/A) - Dr. Phineas Real to assist.  2nd choice will be 1:00pm on 01/07/14. Thanks! as a surgical intervention .  The patient's history has been reviewed, patient examined, no change in status, stable for surgery.  I have reviewed the patient's chart and labs.  Questions were answered to the patient's satisfaction.     Terrance Mass

## 2014-01-07 NOTE — Anesthesia Postprocedure Evaluation (Signed)
  Anesthesia Post-op Note  Patient: Emi Belfast  Procedure(s) Performed: Procedure(s) with comments: HYSTERECTOMY ABDOMINAL WITH BILATERAL SALPINGECTOMY (N/A) - Dr. Phineas Real to assist.  2nd choice will be 1:00pm on 01/07/14. Thanks!  Patient Location: PACU and Women's Unit  Anesthesia Type:General  Level of Consciousness: awake, alert , oriented and patient cooperative  Airway and Oxygen Therapy: Patient Spontanous Breathing  Post-op Pain: mild  Post-op Assessment: Post-op Vital signs reviewed, Patient's Cardiovascular Status Stable, Respiratory Function Stable and No signs of Nausea or vomiting  Post-op Vital Signs: Reviewed and stable  Last Vitals:  Filed Vitals:   01/07/14 1738  BP: 144/73  Pulse: 76  Temp: 36.8 C  Resp: 12    Complications: No apparent anesthesia complications

## 2014-01-07 NOTE — Transfer of Care (Signed)
Immediate Anesthesia Transfer of Care Note  Patient: Katelyn [name]  Procedure(s) Performed: Procedure(s) with comments: HYSTERECTOMY ABDOMINAL WITH BILATERAL SALPINGECTOMY (N/A) - Dr. Phineas Real to assist.  2nd choice will be 1:00pm on 01/07/14. Thanks!  Patient Location: PACU  Anesthesia Type:General  Level of Consciousness: awake, oriented and sedated  Airway & Oxygen Therapy: Patient Spontanous Breathing and Patient connected to nasal cannula oxygen  Post-op Assessment: Report given to PACU RN and Post -op Vital signs reviewed and stable  Post vital signs: Reviewed and stable  Complications: No apparent anesthesia complications

## 2014-01-08 ENCOUNTER — Encounter (HOSPITAL_COMMUNITY): Payer: Self-pay | Admitting: Gynecology

## 2014-01-08 LAB — CBC
HEMATOCRIT: 35.6 % — AB (ref 36.0–46.0)
Hemoglobin: 11.8 g/dL — ABNORMAL LOW (ref 12.0–15.0)
MCH: 30 pg (ref 26.0–34.0)
MCHC: 33.1 g/dL (ref 30.0–36.0)
MCV: 90.6 fL (ref 78.0–100.0)
Platelets: 314 10*3/uL (ref 150–400)
RBC: 3.93 MIL/uL (ref 3.87–5.11)
RDW: 14.2 % (ref 11.5–15.5)
WBC: 12.8 10*3/uL — AB (ref 4.0–10.5)

## 2014-01-08 MED ORDER — DOCUSATE SODIUM 100 MG PO CAPS
100.0000 mg | ORAL_CAPSULE | Freq: Two times a day (BID) | ORAL | Status: DC
  Administered 2014-01-08 – 2014-01-09 (×2): 100 mg via ORAL
  Filled 2014-01-08 (×2): qty 1

## 2014-01-08 MED ORDER — OXYCODONE-ACETAMINOPHEN 5-325 MG PO TABS
1.0000 | ORAL_TABLET | Freq: Four times a day (QID) | ORAL | Status: DC | PRN
  Administered 2014-01-08: 2 via ORAL
  Filled 2014-01-08 (×2): qty 2

## 2014-01-08 MED ORDER — OXYCODONE-ACETAMINOPHEN 5-325 MG PO TABS
1.0000 | ORAL_TABLET | ORAL | Status: DC | PRN
  Administered 2014-01-08 (×2): 2 via ORAL
  Administered 2014-01-09: 1 via ORAL
  Administered 2014-01-09: 2 via ORAL
  Filled 2014-01-08: qty 1
  Filled 2014-01-08 (×2): qty 2

## 2014-01-08 MED ORDER — BISACODYL 10 MG RE SUPP
10.0000 mg | Freq: Every day | RECTAL | Status: DC | PRN
  Administered 2014-01-08: 10 mg via RECTAL
  Filled 2014-01-08: qty 1

## 2014-01-08 MED ORDER — SIMETHICONE 80 MG PO CHEW
80.0000 mg | CHEWABLE_TABLET | Freq: Four times a day (QID) | ORAL | Status: DC | PRN
  Administered 2014-01-08: 80 mg via ORAL

## 2014-01-08 NOTE — Progress Notes (Signed)
01/08/14 1700  Clinical Encounter Type  Visited With Patient and family together (mom)  Visit Type Spiritual support;Social support  Spiritual Encounters  Spiritual Needs Grief support;Emotional;Prayer  Stress Factors  Patient Stress Factors (bereavement/healing--facing 1st anniversary of dtr's death)   Served as a witness to Katelyn [name]'s pain and healing as she shared the story of her daughter [name]'s life and death (stroke at the end of 2013-01-15).  Provided empathic, reflective listening and prayer.  Pt very grateful; we plan for me to follow up tomorrow.  Please also page as needed.  Thank you.  [place], [place]

## 2014-01-08 NOTE — Progress Notes (Signed)
1 Day Post-Op Procedure(s) (LRB): HYSTERECTOMY ABDOMINAL WITH BILATERAL SALPINGECTOMY (N/A)  Subjective: Patient reports tolerating PO and no problems voiding.    Objective: I have reviewed patient's vital signs, intake and output, medications and labs. HgB 11.8  General: alert Resp: clear to auscultation bilaterally Cardio: regular rate and rhythm, S1, S2 normal, no murmur, click, rub or gallop GI: soft, non-tender; bowel sounds normal; no masses,  no organomegaly Extremities: extremities normal, atraumatic, no cyanosis or edema Vaginal Bleeding: none  Assessment: s/p Procedure(s) with comments: HYSTERECTOMY ABDOMINAL WITH BILATERAL SALPINGECTOMY (N/A) - Dr. Phineas Real to assist.  2nd choice will be 1:00pm on 01/07/14. Thanks!: stable and progressing well  Plan: Advance diet Encourage ambulation Advance to PO medication  LOS: 1 day    Terrance Mass 01/08/2014, 7:58 AM

## 2014-01-08 NOTE — Progress Notes (Signed)
01/08/14 1500  Clinical Encounter Type  Visited With Patient not available;Health care provider (too much pain for visit)  Visit Type Spiritual support;Social support  Referral From Nurse Vonzella Nipple, RN)  Consult/Referral To Chaplain  Spiritual Encounters  Spiritual Needs Grief support;Emotional  Stress Factors  Patient Stress Factors (anniversary of death of her child)   Attempted visit for bereavement support, but Ms Courtois was in too much physical pain at the time.  Consulted with Vonzella Nipple, RN.  [facility] is aware of pt's grief history/anniversary and will follow up tomorrow for support, but please page as needs arise or timing would be best for pt.  Thank you.  [place], [place]

## 2014-01-09 MED ORDER — SIMETHICONE 80 MG PO CHEW: 80.0000 mg | CHEWABLE_TABLET | Freq: Four times a day (QID) | ORAL | Status: DC | PRN

## 2014-01-09 MED ORDER — METOCLOPRAMIDE HCL 10 MG PO TABS: 10.0000 mg | ORAL_TABLET | Freq: Four times a day (QID) | ORAL | Status: DC | PRN

## 2014-01-09 MED ORDER — BISACODYL 10 MG RE SUPP: 10.0000 mg | Freq: Every day | RECTAL | Status: DC | PRN

## 2014-01-09 MED ORDER — OXYCODONE-ACETAMINOPHEN 5-325 MG PO TABS: 1.0000 | ORAL_TABLET | ORAL | Status: DC | PRN

## 2014-01-09 MED ORDER — IBUPROFEN 800 MG PO TABS: 800.0000 mg | ORAL_TABLET | Freq: Three times a day (TID) | ORAL | Status: DC | PRN

## 2014-01-09 MED ORDER — DSS 100 MG PO CAPS: 100.0000 mg | ORAL_CAPSULE | Freq: Two times a day (BID) | ORAL | Status: DC

## 2014-01-09 NOTE — Progress Notes (Signed)
Pt is discharged in the care of parents Downstairs per ambulatory with N.T. Escort. Denies any pain or discomfort.. No  Equipment needed for home use.Abdominal incision is clean and dy Understands all discharge instructions Stable.

## 2014-01-09 NOTE — Progress Notes (Signed)
2 Days Post-Op Procedure(s) (LRB): HYSTERECTOMY ABDOMINAL WITH BILATERAL SALPINGECTOMY (N/A)  Subjective: Patient reports tolerating PO, + flatus and no problems voiding.    Objective: I have reviewed patient's vital signs, intake and output, medications and labs.  MAXIMUM TEMPERATURE 0515 hours 100 Repeat temperature 0625 hours was 99.9  General: alert and cooperative Resp: clear to auscultation bilaterally Cardio: regular rate and rhythm, S1, S2 normal, no murmur, click, rub or gallop GI: soft, non-tender; bowel sounds normal; no masses,  no organomegaly Extremities: extremities normal, atraumatic, no cyanosis or edema Vaginal Bleeding: none  Assessment: s/p Procedure(s) with comments: HYSTERECTOMY ABDOMINAL WITH BILATERAL SALPINGECTOMY (N/A) - Dr. Phineas Real to assist.  2nd choice will be 1:00pm on 01/07/14. Thanks!: stable, progressing well, tolerating diet and post-op fever  Plan: Advance diet Encourage ambulation Advance to PO medication Discontinue IV fluids Plan discharge home this afternoon  It was stressed upon the patient that she needs to get more out of bed and ambulate and she can shower today. She has been on Mylicon 80 mg 4 times a day. She's also been given Colace twice a day. Yesterday she was given the colonic suppository and had a bowel movement this morning and is passing gas. We will make plans to reassess this afternoon and discharged home.   LOS: 2 days    Terrance Mass 01/09/2014, 7:52 AM

## 2014-01-09 NOTE — Discharge Summary (Signed)
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 Patient did well intraoperatively. Postop her maximum temperature was 100 this morning at 0515 hours and now time for discharge her temperature was 98.8. Patient had a Foley catheter for the first 24 hours with adequate urinary output and discontinued. She continued to void spontaneously on her own. The PCA pump was discontinued also after 24 hours and she was started on oral Percocet 1 by mouth every 4-6 hours when necessary. Patient had trouble with gas and was bulging but eventually had a bowel movement and pass gas. She was able to tolerate her liquid diet and was advanced to a regular diet. Her vital signs otherwise remained stable. Patient was up and ambulating and tolerating full regular diet and was ready to be discharged home this afternoon.  Discharge vital signs: Temperature 98.8, pulse 85 blood pressure 117/72  Exam: Lungs: Clear to auscultation or rubs or wheezes Heart: Regular rate and rhythm no murmurs or gallops Abdomen: Soft positive bowel sounds noted in all 4 quadrants Incision site was intact Vaginal pad with no bleeding Extremities: No cords or edema  Preoperative hemoglobin 11.8 postop hemoglobin 11.8 Pathology report: Diagnosis Uterus and bilateral fallopian tubes - LEIOMYOMATA. - ENDOMETRIUM: BENIGN ENDOMETRIAL POLYP AND ADJACENT BENIGN PROLIFERATIVE ENDOMETRIUM, NO ATYPIA, HYPERPLASIA OR MALIGNANCY. - CERVIX: BENIGN SQUAMOUS MUCOSA AND ENDOCERVICAL MUCOSA, NO DYSPLASIA OR MALIGNANCY. - BILATERAL FALLOPIAN TUBES: BENIGN FALLOPIAN TUBAL TISSUE, NO ATYPIA OR MALIGNANCY.  Findings from pathology were discussed with the patient.  Assessment/plan: Patient status post total abdominal hysterectomy with bilateral  salpingectomy for symptomatic leiomyomatous uteri doing well ready to be discharged home this afternoon. Patient was prescribed Percocet 5-325 mg one by mouth q. 4-6 hours when necessary. Motrin 800 mg 1 by mouth 3 times a day when necessary Colace 1 tablet twice a day. Patient was instructed to hold off her iron supplementation. She was scheduled for a postop visit in 2 weeks. Instructions provided.

## 2014-01-13 ENCOUNTER — Telehealth: Payer: Self-pay | Admitting: *Deleted

## 2014-01-13 NOTE — Telephone Encounter (Signed)
The effects of that Lupron should be and also in the next few weeks he will become less and less of hot flashes. If her surgery has been over a week ago she can drive as along as she is not taking narcotics shortly before that.

## 2014-01-13 NOTE — Telephone Encounter (Signed)
This part of the message is unclear  "The effects of that Lupron should be and also in the next few weeks he will become less and less of hot flashes" please advise

## 2014-01-13 NOTE — Telephone Encounter (Signed)
Pt informed with the below note. 

## 2014-01-13 NOTE — Telephone Encounter (Signed)
Pt has post op appointment on June 4th she asked if okay for her to drive? And has noticed increase hot flashes night and day, pt said hot flashes started once she received Lupron injection. Drinks plenty of fluids, recommendations?  Please advise

## 2014-01-13 NOTE — Telephone Encounter (Signed)
Repeat the effects from the Lupron may linger for 3-4 months

## 2014-01-21 ENCOUNTER — Telehealth: Payer: Self-pay | Admitting: *Deleted

## 2014-01-21 NOTE — Telephone Encounter (Signed)
Pt called requesting refill on percocet or okay to try OTC c/o slight discomfort. I explained to patient to try OTC to she if this helps with relief, if this doesn't then I will sent Dr.Fontaine a message regarding discomfort. Pt was fine with this. She will follow up as needed

## 2014-01-27 ENCOUNTER — Ambulatory Visit: Payer: BC Managed Care – PPO | Admitting: Gynecology

## 2014-01-29 ENCOUNTER — Ambulatory Visit (INDEPENDENT_AMBULATORY_CARE_PROVIDER_SITE_OTHER): Payer: BC Managed Care – PPO | Admitting: Gynecology

## 2014-01-29 ENCOUNTER — Encounter: Payer: Self-pay | Admitting: Gynecology

## 2014-01-29 VITALS — BP 122/80

## 2014-01-29 DIAGNOSIS — Z09 Encounter for follow-up examination after completed treatment for conditions other than malignant neoplasm: Secondary | ICD-10-CM

## 2014-01-29 NOTE — Progress Notes (Signed)
   Patient presented to the office today for a three-week postop visit. Patient on May 13 underwent total abdominal hysterectomy with bilateral salpingectomy as a result of symptomatic leiomyomatous uteri (dysmenorrhea, menorrhagia, and anemia). Patient is doing well. Pathology report as outlined below was discussed:  Uterus and bilateral fallopian tubes - LEIOMYOMATA. - ENDOMETRIUM: BENIGN ENDOMETRIAL POLYP AND ADJACENT BENIGN PROLIFERATIVE ENDOMETRIUM, NO ATYPIA, HYPERPLASIA OR MALIGNANCY. - CERVIX: BENIGN SQUAMOUS MUCOSA AND ENDOCERVICAL MUCOSA, NO DYSPLASIA OR MALIGNANCY. - BILATERAL FALLOPIAN TUBES: BENIGN FALLOPIAN TUBAL TISSUE, NO ATYPIA OR MALIGNANCY.  Exam: Abdomen: Soft nontender no rebound or guarding Pfannenstiel incision healed. Pelvic: Bartholin urethra Skene was within normal limits Vagina: No lesions or discharge Vaginal cuff intact Bimanual exam no palpable mass or tenderness Rectal exam not done  Assessment/plan: Patient status post total abdominal hysterectomy with bilateral salpingectomy as a result of symptomatic leiomyomatous uteri 3 weeks postop doing well. Postop hemoglobin was 11.8 we will check a CBC today. Patient returned back in 3 weeks for final postop visit.

## 2014-02-17 ENCOUNTER — Ambulatory Visit (INDEPENDENT_AMBULATORY_CARE_PROVIDER_SITE_OTHER): Payer: BC Managed Care – PPO | Admitting: Gynecology

## 2014-02-17 ENCOUNTER — Encounter: Payer: Self-pay | Admitting: Gynecology

## 2014-02-17 DIAGNOSIS — D649 Anemia, unspecified: Secondary | ICD-10-CM

## 2014-02-17 DIAGNOSIS — Z09 Encounter for follow-up examination after completed treatment for conditions other than malignant neoplasm: Secondary | ICD-10-CM

## 2014-02-17 LAB — CBC WITH DIFFERENTIAL/PLATELET
BASOS ABS: 0 10*3/uL (ref 0.0–0.1)
BASOS PCT: 0 % (ref 0–1)
Eosinophils Absolute: 0.2 10*3/uL (ref 0.0–0.7)
Eosinophils Relative: 3 % (ref 0–5)
HEMATOCRIT: 35.8 % — AB (ref 36.0–46.0)
Hemoglobin: 12.1 g/dL (ref 12.0–15.0)
LYMPHS PCT: 18 % (ref 12–46)
Lymphs Abs: 1.4 10*3/uL (ref 0.7–4.0)
MCH: 29.1 pg (ref 26.0–34.0)
MCHC: 33.8 g/dL (ref 30.0–36.0)
MCV: 86.1 fL (ref 78.0–100.0)
Monocytes Absolute: 0.5 10*3/uL (ref 0.1–1.0)
Monocytes Relative: 6 % (ref 3–12)
NEUTROS PCT: 73 % (ref 43–77)
Neutro Abs: 5.5 10*3/uL (ref 1.7–7.7)
PLATELETS: 348 10*3/uL (ref 150–400)
RBC: 4.16 MIL/uL (ref 3.87–5.11)
RDW: 14.5 % (ref 11.5–15.5)
WBC: 7.5 10*3/uL (ref 4.0–10.5)

## 2014-02-17 NOTE — Progress Notes (Addendum)
   Patient presented to the office today for her 6 weeks postop appointment. She is doing well otherwise. She will be scheduling an appointment to remove a left medial thigh pigmented mole like lesion. On May 13 underwent total abdominal hysterectomy with bilateral salpingectomy as a result of symptomatic leiomyomatous uteri (dysmenorrhea, menorrhagia, and anemia). Patient is doing well. Pathology report as outlined below was discussed:  Uterus and bilateral fallopian tubes  - LEIOMYOMATA.  - ENDOMETRIUM: BENIGN ENDOMETRIAL POLYP AND ADJACENT BENIGN PROLIFERATIVE  ENDOMETRIUM, NO ATYPIA, HYPERPLASIA OR MALIGNANCY.  - CERVIX: BENIGN SQUAMOUS MUCOSA AND ENDOCERVICAL MUCOSA, NO DYSPLASIA OR  MALIGNANCY.  - BILATERAL FALLOPIAN TUBES: BENIGN FALLOPIAN TUBAL TISSUE, NO ATYPIA OR  MALIGNANCY.  Exam:  Abdomen: Soft nontender no rebound or guarding Pfannenstiel incision healed.  Pelvic: Bartholin urethra Skene was within normal limits  Vagina: No lesions or discharge  Vaginal cuff intact  Bimanual exam no palpable mass or tenderness  Rectal exam not done  Left medial thigh pigmented mole like lesion .    Assessment/plan: Patient 6 weeks status abdominal hysterectomy with bilateral salpingectomy as a result of symptomatic leiomyomatous uteri. Pathology report benign. Patient will return back to the office in 1-2 weeks to remove a pigmented left medial thigh mole. Patient may resume full normal activity. A CBC will be drawn today to followup on her anemia.

## 2014-03-05 ENCOUNTER — Encounter: Payer: Self-pay | Admitting: Gynecology

## 2014-03-05 ENCOUNTER — Ambulatory Visit (INDEPENDENT_AMBULATORY_CARE_PROVIDER_SITE_OTHER): Payer: BC Managed Care – PPO | Admitting: Gynecology

## 2014-03-05 VITALS — BP 122/80

## 2014-03-05 DIAGNOSIS — L989 Disorder of the skin and subcutaneous tissue, unspecified: Secondary | ICD-10-CM | POA: Insufficient documentation

## 2014-03-05 MED ORDER — HYDROCODONE-ACETAMINOPHEN 5-325 MG PO TABS: 1.0000 | ORAL_TABLET | Freq: Four times a day (QID) | ORAL | Status: DC | PRN

## 2014-03-05 MED ORDER — CEPHALEXIN 500 MG PO CAPS: 500.0000 mg | ORAL_CAPSULE | Freq: Two times a day (BID) | ORAL | Status: DC

## 2014-03-05 NOTE — Progress Notes (Signed)
   Patient presented to the office today for biopsy of her medial left thigh raised hyperpigmented lesions.  Physical Exam  Skin:      patient was counseled for the lesions to be removed for biopsy. The area was cleansed with Betadine solution. One percent lidocaine was infiltrated subdermally. With the scalpel the 3 individual lesions were excised and submitted for histological evaluation. The subcutaneous tissue was reapproximated with 2-0 Vicryl suture and the skin was reapproximated with 3-0 Vicryl suture. Neosporin and a pressure dressing were placed.  Assessment/plan: Patient with excision of 3 left medial thigh lesions to rule out melanoma. Patient will be prescribed Keflex 500 mg twice a day for 10 days for prophylaxis. She'll apply Neosporin at night. She will return back to the office in 2 weeks to remove her sutures. Activity and restrictions were discussed with the patient until her sutures are removed. Will notify her with the results of the biopsy become available. She was prescribed also Vicodin 5-325 mg one by mouth every 4-6 hours when necessary.

## 2014-03-19 ENCOUNTER — Encounter: Payer: Self-pay | Admitting: Gynecology

## 2014-03-19 ENCOUNTER — Ambulatory Visit (INDEPENDENT_AMBULATORY_CARE_PROVIDER_SITE_OTHER): Payer: BC Managed Care – PPO | Admitting: Gynecology

## 2014-03-19 VITALS — BP 122/76

## 2014-03-19 DIAGNOSIS — Z4802 Encounter for removal of sutures: Secondary | ICD-10-CM

## 2014-03-19 DIAGNOSIS — R350 Frequency of micturition: Secondary | ICD-10-CM

## 2014-03-19 LAB — URINALYSIS W MICROSCOPIC + REFLEX CULTURE
Bilirubin Urine: NEGATIVE
CRYSTALS: NONE SEEN
Casts: NONE SEEN
Glucose, UA: NEGATIVE mg/dL
Ketones, ur: NEGATIVE mg/dL
Leukocytes, UA: NEGATIVE
NITRITE: NEGATIVE
PROTEIN: NEGATIVE mg/dL
Specific Gravity, Urine: 1.015 (ref 1.005–1.030)
UROBILINOGEN UA: 0.2 mg/dL (ref 0.0–1.0)
WBC, UA: NONE SEEN WBC/hpf (ref <3)
pH: 5.5 (ref 5.0–8.0)

## 2014-03-19 NOTE — Progress Notes (Signed)
   Patient presented to the office today to remove the sutures from her left medial thigh whereby largest skin tag was removed. Patient also wanted to have a urinalysis to make sure she did have a urinary tract infection because several days ago she felt some pressure and frequency but was asymptomatic today.  Exam: Medial thigh area sutures were removed borders healing nicely no evidence of infection or erythema.  Pathology report: Skin , left medial thigh ACROCHORDON  Urinalysis rare bacteria 0-2 RBC no WBC  Assessment/plan: Patient doing well status post removal of large left medial thigh skin tag almost completely healed. Patient has resumed for normal activity. Urinalysis negative. Patient returned back to the office next year for her annual exam or when necessary.

## 2014-06-29 ENCOUNTER — Encounter: Payer: Self-pay | Admitting: Gynecology

## 2014-06-30 DIAGNOSIS — Z0289 Encounter for other administrative examinations: Secondary | ICD-10-CM

## 2014-07-02 ENCOUNTER — Telehealth: Payer: Self-pay | Admitting: *Deleted

## 2014-07-02 NOTE — Telephone Encounter (Signed)
Pt is requesting [place] write a letter to job stating that we were aware of her daughter's passing in 01/2013 and that we began treating her with medication. This letter is being requested due to the fact that her job didn't provide her with the appropriate grieving time and she has pursued further action with her job. I will obtain authorization that states I am able to provide the information of her being treated by [place] before the letter is handed to her. She is appreciative of this. A letter is written and signed by Dr Toney Rakes. The patient will be called when the letter is ready. KW CMA

## 2014-09-29 ENCOUNTER — Ambulatory Visit (INDEPENDENT_AMBULATORY_CARE_PROVIDER_SITE_OTHER): Payer: BC Managed Care – PPO | Admitting: Family Medicine

## 2014-09-29 VITALS — BP 128/80 | HR 76 | Temp 98.5°F | Resp 18 | Ht 65.0 in | Wt 200.0 lb

## 2014-09-29 DIAGNOSIS — B9689 Other specified bacterial agents as the cause of diseases classified elsewhere: Secondary | ICD-10-CM

## 2014-09-29 DIAGNOSIS — N898 Other specified noninflammatory disorders of vagina: Secondary | ICD-10-CM

## 2014-09-29 DIAGNOSIS — N760 Acute vaginitis: Secondary | ICD-10-CM

## 2014-09-29 DIAGNOSIS — R5383 Other fatigue: Secondary | ICD-10-CM

## 2014-09-29 DIAGNOSIS — Z832 Family history of diseases of the blood and blood-forming organs and certain disorders involving the immune mechanism: Secondary | ICD-10-CM

## 2014-09-29 DIAGNOSIS — A499 Bacterial infection, unspecified: Secondary | ICD-10-CM

## 2014-09-29 DIAGNOSIS — R202 Paresthesia of skin: Secondary | ICD-10-CM

## 2014-09-29 LAB — POCT URINALYSIS DIPSTICK
Bilirubin, UA: NEGATIVE
Blood, UA: NEGATIVE
Glucose, UA: NEGATIVE
Ketones, UA: NEGATIVE
Leukocytes, UA: NEGATIVE
Nitrite, UA: NEGATIVE
Protein, UA: NEGATIVE
Spec Grav, UA: <=1.005
Urobilinogen, UA: 0.2
pH, UA: 5.5

## 2014-09-29 LAB — POCT WET PREP WITH KOH
KOH Prep POC: NEGATIVE
RBC Wet Prep HPF POC: NEGATIVE
Trichomonas, UA: NEGATIVE
Yeast Wet Prep HPF POC: NEGATIVE

## 2014-09-29 LAB — POCT UA - MICROSCOPIC ONLY
Casts, Ur, LPF, POC: NEGATIVE
Crystals, Ur, HPF, POC: NEGATIVE
Mucus, UA: NEGATIVE
RBC, urine, microscopic: NEGATIVE
WBC, Ur, HPF, POC: NEGATIVE
Yeast, UA: NEGATIVE

## 2014-09-29 LAB — POCT GLYCOSYLATED HEMOGLOBIN (HGB A1C): Hemoglobin A1C: 5.3

## 2014-09-29 LAB — GLUCOSE, POCT (MANUAL RESULT ENTRY): POC Glucose: 76 mg/dl (ref 70–99)

## 2014-09-29 NOTE — Patient Instructions (Signed)
Carpal Tunnel Syndrome °Carpal tunnel syndrome is a disorder of the nervous system in the wrist that causes pain, hand weakness, and/or loss of feeling. Carpal tunnel syndrome is caused by the compression, stretching, or irritation of the median nerve at the wrist joint. Athletes who experience carpal tunnel syndrome may notice a decrease in their performance to the condition, especially for sports that require strong hand or wrist action.  °SYMPTOMS  °· Tingling, numbness, or burning pain in the hand or fingers. °· Inability to sleep due to pain in the hand. °· Sharp pains that shoot from the wrist up the arm or to the fingers, especially at night. °· Morning stiffness or cramping of the hand. °· Thumb weakness, resulting in difficulty holding objects or making a fist. °· Shiny, dry skin on the hand. °· Reduced performance in any sport requiring a strong grip. °CAUSES  °· Median nerve damage at the wrist is caused by pressure due to swelling, inflammation, or scarred tissue. °· Sources of pressure include: °¨ Repetitive gripping or squeezing that causes inflammation of the tendon sheaths. °¨ Scarring or shortening of the ligament that covers the median nerve. °¨ Traumatic injury to the wrist or forearm such as fracture, sprain, or dislocation. °¨ Prolonged hyperextension (wrist bent backward) or hyperflexion (wrist bent downward) of the wrist. °RISK INCREASES WITH: °· Diabetes mellitus. °· Menopause or amenorrhea. °· Rheumatoid arthritis. °· Raynaud disease. °· Pregnancy. °· Gout. °· Kidney disease. °· Ganglion cyst. °· Repetitive hand or wrist action. °· Hypothyroidism (underactive thyroid gland). °· Repetitive jolting or shaking of the hands or wrist. °· Prolonged forceful weight-bearing on the hands. °PREVENTION °· Bracing the hand and wrist straight during activities that involve repetitive grasping. °· For activities that require prolonged extension of the wrist (bending towards the top of the forearm)  periodically change the position of your wrists. °· Learn and use proper technique in activities that result in the wrist position in neutral to slight extension. °· Avoid bending the wrist into full extension or flexion (up or down). °· Keep the wrist in a straight (neutral) position. To keep the wrist in this position, wear a splint. °· Avoid repetitive hand and wrist motions. °· When possible avoid prolonged grasping of items (steering wheel of a car, a pen, a vacuum cleaner, or a rake). °· Loosen your grip for activities that require prolonged grasping of items. °· Place keyboards and writing surfaces at the correct height as to decrease strain on the wrist and hand. °· Alternate work tasks to avoid prolonged wrist flexion. °· Avoid pinching activities (needlework and writing) as they may irritate your carpal tunnel syndrome. °· If these activities are necessary, complete them for shorter periods of time. °· When writing, use a felt tip or rollerball pen and/or build up the grip on a pen to decrease the forces required for writing. °PROGNOSIS  °Carpal tunnel syndrome is usually curable with appropriate conservative treatment and sometimes resolves spontaneously. For some cases, surgery is necessary, especially if muscle wasting or nerve changes have developed.  °RELATED COMPLICATIONS  °· Permanent numbness and a weak thumb or fingers in the affected hand. °· Permanent paralysis of a portion of the hand and fingers. °TREATMENT  °Treatment initially consists of stopping activities that aggravate the symptoms as well as medication and ice to reduce inflammation. A wrist splint is often recommended for wear during activities of repetitive motion as well as at night. It is also important to learn and use proper technique when   performing activities that typically cause pain. On occasion, a corticosteroid injection may be given. If symptoms persist despite conservative treatment, surgery may be an option. Surgical  techniques free the pinched or compressed nerve. Carpal tunnel surgery is usually performed on an outpatient basis, meaning you go home the same day as surgery. These procedures provide almost complete relief of all symptoms in 95% of patients. Expect at least 2 weeks for healing after surgery. For cases that are the result of repeated jolting or shaking of the hand or wrist or prolonged hyperextension, surgery is not usually recommended because stretching of the median nerve, not compression, is usually the cause of carpal tunnel syndrome in these cases. MEDICATION   If pain medication is necessary, nonsteroidal anti-inflammatory medications, such as aspirin and ibuprofen, or other minor pain relievers, such as acetaminophen, are often recommended.  Do not take pain medication for 7 days before surgery.  Prescription pain relievers are usually only prescribed after surgery. Use only as directed and only as much as you need.  Corticosteroid injections may be given to reduce inflammation. However, they are not always recommended.  Vitamin B6 (pyridoxine) may reduce symptoms; use only if prescribed for your disorder. SEEK MEDICAL CARE IF:   Symptoms get worse or do not improve in 2 weeks despite treatment.  You also have a current or recent history of neck or shoulder injury that has resulted in pain or tingling elsewhere in your arm. Document Released: 08/14/2005 Document Revised: 12/29/2013 Document Reviewed: 11/26/2008 [facility] Patient Information 2015 [place], [place]. This information is not intended to replace advice given to you by your health care provider. Make sure you discuss any questions you have with your health care provider. Plantar Fasciitis (Heel Spur Syndrome) with Rehab The plantar fascia is a fibrous, ligament-like, soft-tissue structure that spans the bottom of the foot. Plantar fasciitis is a condition that causes pain in the foot due to inflammation of the tissue. SYMPTOMS     Pain and tenderness on the underneath side of the foot.  Pain that worsens with standing or walking. CAUSES  Plantar fasciitis is caused by irritation and injury to the plantar fascia on the underneath side of the foot. Common mechanisms of injury include:  Direct trauma to bottom of the foot.  Damage to a small nerve that runs under the foot where the main fascia attaches to the heel bone.  Stress placed on the plantar fascia due to bone spurs. RISK INCREASES WITH:   Activities that place stress on the plantar fascia (running, jumping, pivoting, or cutting).  Poor strength and flexibility.  Improperly fitted shoes.  Tight calf muscles.  Flat feet.  Failure to warm-up properly before activity.  Obesity. PREVENTION  Warm up and stretch properly before activity.  Allow for adequate recovery between workouts.  Maintain physical fitness:  Strength, flexibility, and endurance.  Cardiovascular fitness.  Maintain a health body weight.  Avoid stress on the plantar fascia.  Wear properly fitted shoes, including arch supports for individuals who have flat feet. PROGNOSIS  If treated properly, then the symptoms of plantar fasciitis usually resolve without surgery. However, occasionally surgery is necessary. RELATED COMPLICATIONS   Recurrent symptoms that may result in a chronic condition.  Problems of the lower back that are caused by compensating for the injury, such as limping.  Pain or weakness of the foot during push-off following surgery.  Chronic inflammation, scarring, and partial or complete fascia tear, occurring more often from repeated injections. TREATMENT  Treatment initially  involves the use of ice and medication to help reduce pain and inflammation. The use of strengthening and stretching exercises may help reduce pain with activity, especially stretches of the Achilles tendon. These exercises may be performed at home or with a therapist. Your caregiver  may recommend that you use heel cups of arch supports to help reduce stress on the plantar fascia. Occasionally, corticosteroid injections are given to reduce inflammation. If symptoms persist for greater than 6 months despite non-surgical (conservative), then surgery may be recommended.  MEDICATION   If pain medication is necessary, then nonsteroidal anti-inflammatory medications, such as aspirin and ibuprofen, or other minor pain relievers, such as acetaminophen, are often recommended.  Do not take pain medication within 7 days before surgery.  Prescription pain relievers may be given if deemed necessary by your caregiver. Use only as directed and only as much as you need.  Corticosteroid injections may be given by your caregiver. These injections should be reserved for the most serious cases, because they may only be given a certain number of times. HEAT AND COLD  Cold treatment (icing) relieves pain and reduces inflammation. Cold treatment should be applied for 10 to 15 minutes every 2 to 3 hours for inflammation and pain and immediately after any activity that aggravates your symptoms. Use ice packs or massage the area with a piece of ice (ice massage).  Heat treatment may be used prior to performing the stretching and strengthening activities prescribed by your caregiver, physical therapist, or athletic trainer. Use a heat pack or soak the injury in warm water. SEEK IMMEDIATE MEDICAL CARE IF:  Treatment seems to offer no benefit, or the condition worsens.  Any medications produce adverse side effects. EXERCISES RANGE OF MOTION (ROM) AND STRETCHING EXERCISES - Plantar Fasciitis (Heel Spur Syndrome) These exercises may help you when beginning to rehabilitate your injury. Your symptoms may resolve with or without further involvement from your physician, physical therapist or athletic trainer. While completing these exercises, remember:   Restoring tissue flexibility helps normal motion to  return to the joints. This allows healthier, less painful movement and activity.  An effective stretch should be held for at least 30 seconds.  A stretch should never be painful. You should only feel a gentle lengthening or release in the stretched tissue. RANGE OF MOTION - Toe Extension, Flexion  Sit with your right / left leg crossed over your opposite knee.  Grasp your toes and gently pull them back toward the top of your foot. You should feel a stretch on the bottom of your toes and/or foot.  Hold this stretch for __________ seconds.  Now, gently pull your toes toward the bottom of your foot. You should feel a stretch on the top of your toes and or foot.  Hold this stretch for __________ seconds. Repeat __________ times. Complete this stretch __________ times per day.  RANGE OF MOTION - Ankle Dorsiflexion, Active Assisted  Remove shoes and sit on a chair that is preferably not on a carpeted surface.  Place right / left foot under knee. Extend your opposite leg for support.  Keeping your heel down, slide your right / left foot back toward the chair until you feel a stretch at your ankle or calf. If you do not feel a stretch, slide your bottom forward to the edge of the chair, while still keeping your heel down.  Hold this stretch for __________ seconds. Repeat __________ times. Complete this stretch __________ times per day.  STRETCH - Gastroc,  Standing  Place hands on wall.  Extend right / left leg, keeping the front knee somewhat bent.  Slightly point your toes inward on your back foot.  Keeping your right / left heel on the floor and your knee straight, shift your weight toward the wall, not allowing your back to arch.  You should feel a gentle stretch in the right / left calf. Hold this position for __________ seconds. Repeat __________ times. Complete this stretch __________ times per day. STRETCH - Soleus, Standing  Place hands on wall.  Extend right / left leg,  keeping the other knee somewhat bent.  Slightly point your toes inward on your back foot.  Keep your right / left heel on the floor, bend your back knee, and slightly shift your weight over the back leg so that you feel a gentle stretch deep in your back calf.  Hold this position for __________ seconds. Repeat __________ times. Complete this stretch __________ times per day. STRETCH - Gastrocsoleus, Standing  Note: This exercise can place a lot of stress on your foot and ankle. Please complete this exercise only if specifically instructed by your caregiver.   Place the ball of your right / left foot on a step, keeping your other foot firmly on the same step.  Hold on to the wall or a rail for balance.  Slowly lift your other foot, allowing your body weight to press your heel down over the edge of the step.  You should feel a stretch in your right / left calf.  Hold this position for __________ seconds.  Repeat this exercise with a slight bend in your right / left knee. Repeat __________ times. Complete this stretch __________ times per day.  STRENGTHENING EXERCISES - Plantar Fasciitis (Heel Spur Syndrome)  These exercises may help you when beginning to rehabilitate your injury. They may resolve your symptoms with or without further involvement from your physician, physical therapist or athletic trainer. While completing these exercises, remember:   Muscles can gain both the endurance and the strength needed for everyday activities through controlled exercises.  Complete these exercises as instructed by your physician, physical therapist or athletic trainer. Progress the resistance and repetitions only as guided. STRENGTH - Towel Curls  Sit in a chair positioned on a non-carpeted surface.  Place your foot on a towel, keeping your heel on the floor.  Pull the towel toward your heel by only curling your toes. Keep your heel on the floor.  If instructed by your physician, physical  therapist or athletic trainer, add ____________________ at the end of the towel. Repeat __________ times. Complete this exercise __________ times per day. STRENGTH - Ankle Inversion  Secure one end of a rubber exercise band/tubing to a fixed object (table, pole). Loop the other end around your foot just before your toes.  Place your fists between your knees. This will focus your strengthening at your ankle.  Slowly, pull your big toe up and in, making sure the band/tubing is positioned to resist the entire motion.  Hold this position for __________ seconds.  Have your muscles resist the band/tubing as it slowly pulls your foot back to the starting position. Repeat __________ times. Complete this exercises __________ times per day.  Document Released: 08/14/2005 Document Revised: 11/06/2011 Document Reviewed: 11/26/2008 [facility] Patient Information 2015 [place], [place]. This information is not intended to replace advice given to you by your health care provider. Make sure you discuss any questions you have with your health care provider. Bacterial  Vaginosis Bacterial vaginosis is a vaginal infection that occurs when the normal balance of bacteria in the vagina is disrupted. It results from an overgrowth of certain bacteria. This is the most common vaginal infection in women of childbearing age. Treatment is important to prevent complications, especially in pregnant women, as it can cause a premature delivery. CAUSES  Bacterial vaginosis is caused by an increase in harmful bacteria that are normally present in smaller amounts in the vagina. Several different kinds of bacteria can cause bacterial vaginosis. However, the reason that the condition develops is not fully understood. RISK FACTORS Certain activities or behaviors can put you at an increased risk of developing bacterial vaginosis, including:  Having a new sex partner or multiple sex partners.  Douching.  Using an intrauterine device  (IUD) for contraception. Women do not get bacterial vaginosis from toilet seats, bedding, swimming pools, or contact with objects around them. SIGNS AND SYMPTOMS  Some women with bacterial vaginosis have no signs or symptoms. Common symptoms include:  Grey vaginal discharge.  A fishlike odor with discharge, especially after sexual intercourse.  Itching or burning of the vagina and vulva.  Burning or pain with urination. DIAGNOSIS  Your health care provider will take a medical history and examine the vagina for signs of bacterial vaginosis. A sample of vaginal fluid may be taken. Your health care provider will look at this sample under a microscope to check for bacteria and abnormal cells. A vaginal pH test may also be done.  TREATMENT  Bacterial vaginosis may be treated with antibiotic medicines. These may be given in the form of a pill or a vaginal cream. A second round of antibiotics may be prescribed if the condition comes back after treatment.  HOME CARE INSTRUCTIONS   Only take over-the-counter or prescription medicines as directed by your health care provider.  If antibiotic medicine was prescribed, take it as directed. Make sure you finish it even if you start to feel better.  Do not have sex until treatment is completed.  Tell all sexual partners that you have a vaginal infection. They should see their health care provider and be treated if they have problems, such as a mild rash or itching.  Practice safe sex by using condoms and only having one sex partner. SEEK MEDICAL CARE IF:   Your symptoms are not improving after 3 days of treatment.  You have increased discharge or pain.  You have a fever. MAKE SURE YOU:   Understand these instructions.  Will watch your condition.  Will get help right away if you are not doing well or get worse. FOR MORE INFORMATION  Centers for Disease Control and Prevention, Division of STD Prevention: [employer]  [employer] Association (ASHA): www.ashastd.org  Document Released: 08/14/2005 Document Revised: 06/04/2013 Document Reviewed: 03/26/2013 [facility] Patient Information 2015 [place], [place]. This information is not intended to replace advice given to you by your health care provider. Make sure you discuss any questions you have with your health care provider.

## 2014-09-29 NOTE — Progress Notes (Signed)
Chief Complaint:  Chief Complaint  Patient presents with  . Numbness    left arm  . feet pain    achy feet  . Vaginitis    HPI: Katelyn [name] is a 39 y.o. female who is here for  Bilateral numbness , feet pain and vaginal odor and dc, without itchiness.  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  She has not tried anything for this, she did have gestational DM, She has been tyring to drink fluids, she does aot of compter work, denies carpal tunnel, denies neck or shoulder pain. She is orried because her daughter had left arm numbness and pain and then she came home and she later died, she was 41 y/o, the doctor's think it was a stroke but they do not know where it came from   She has had bilateral feet pain. She denies numbness and tingling, just pain. She denies PF. She has it on the lateral side.   She has had yeast infections. No vaginal discharg currently but several days ago she did and thought it was yeaste. 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 No just odor. Last pap was around MAy and has had  Hysterectomy with ovaries still intact.   Past Medical History  Diagnosis Date  . Anemia   . Hx gestational diabetes     hx 2007  . Fibroid   . Medical history non-contributory   . Depression    Past Surgical History  Procedure Laterality Date  . Cesarean section      x 1  . Pelvic laparoscopy  1993    cyst removed  . Wisdom tooth extraction    . Abdominal hysterectomy N/A 01/07/2014    Procedure: HYSTERECTOMY ABDOMINAL WITH BILATERAL SALPINGECTOMY;  Surgeon: Terrance Mass, MD;  Location: [facility];  Service: Gynecology;  Laterality: N/A;  Dr. Phineas Real to assist.  2nd choice will be 1:00pm on 01/07/14. Thanks!   History   Social History  . Marital Status:  Significant Other    Spouse Name: N/A    Number of Children: N/A  . Years of Education: N/A   Social History Main Topics  . Smoking status: Former Smoker -- 0.25 packs/day for 6 years    Types: Cigarettes    Quit date: 08/28/1997  . Smokeless tobacco: Never Used  . Alcohol Use: Yes     Comment: wine socially  . Drug Use: No  . Sexual Activity: Yes    Birth Control/ Protection: Condom   Other Topics Concern  . None   Social History Narrative   Family History  Problem Relation Age of Onset  . Cancer Maternal Aunt     ovarian and uterine  . Breast cancer Maternal Aunt     unsure of age  . Diabetes Maternal Grandmother   . Hypertension Maternal Grandmother    Allergies  Allergen Reactions  . Latex Other (See Comments)    Burning with discharge with condom use   Prior to Admission medications   Medication Sig Start Date End Date Taking? Authorizing Provider  HYDROcodone-acetaminophen (NORCO/VICODIN) 5-325 MG per tablet Take 1 tablet by mouth every 6 (six) hours as needed. Patient not taking: Reported on 09/29/2014 03/05/14  Terrance Mass, MD     ROS: The patient denies fevers, chills, night sweats, unintentional weight loss, chest pain, palpitations, wheezing, dyspnea on exertion, nausea, vomiting, abdominal pain, dysuria, hematuria, melena, weakness  All other systems have been reviewed and were otherwise negative with the exception of those mentioned in the HPI and as above.    PHYSICAL EXAM: Filed Vitals:   09/29/14 1810  BP: 128/80  Pulse: 76  Temp: 98.5 F (36.9 C)  Resp: 18   Filed Vitals:   09/29/14 1810  Height: 5\' 5"  (1.651 m)  Weight: 200 lb (90.719 kg)   Body mass index is 33.28 kg/(m^2).  General: Alert, no acute distress HEENT:  Normocephalic, atraumatic, oropharynx patent. EOMI, PERRLA Cardiovascular:  Regular rate and rhythm, no rubs murmurs or gallops.  No Carotid bruits, radial pulse intact. No pedal edema.  Respiratory: Clear to  auscultation bilaterally.  No wheezes, rales, or rhonchi.  No cyanosis, no use of accessory musculature GI: No organomegaly, abdomen is soft and non-tender, positive bowel sounds.  No masses. Skin: No rashes. Neurologic: Facial musculature symmetric. Psychiatric: Patient is appropriate throughout our interaction. Lymphatic: No cervical lymphadenopathy Musculoskeletal: Gait intact. Normal neck exam-neg spurling Normal shoulder  Exam -neg Neers/Hawkins Normal arm and wrist exam-neg Wynn Maudlin, neg tinels,, neg Phalens Normal filament exam Normal foot exam in terms of ROM, 5/5 strength, sesnation    LABS: Results for orders placed or performed in visit on 09/29/14  POCT UA - Microscopic Only  Result Value Ref Range   WBC, Ur, HPF, POC neg    RBC, urine, microscopic neg    Bacteria, U Microscopic 1+    Mucus, UA neg    Epithelial cells, urine per micros 0-5    Crystals, Ur, HPF, POC neg    Casts, Ur, LPF, POC neg    Yeast, UA neg   POCT urinalysis dipstick  Result Value Ref Range   Color, UA yellow    Clarity, UA clear    Glucose, UA neg    Bilirubin, UA neg    Ketones, UA neg    Spec Grav, UA <=1.005    Blood, UA neg    pH, UA 5.5    Protein, UA neg    Urobilinogen, UA 0.2    Nitrite, UA neg    Leukocytes, UA Negative   POCT Wet Prep with KOH  Result Value Ref Range   Trichomonas, UA Negative    Clue Cells Wet Prep HPF POC 0-4    Epithelial Wet Prep HPF POC 1-7    Yeast Wet Prep HPF POC neg    Bacteria Wet Prep HPF POC 4+    RBC Wet Prep HPF POC neg    WBC Wet Prep HPF POC 0-10    KOH Prep POC Negative   POCT glucose (manual entry)  Result Value Ref Range   POC Glucose 76 70 - 99 mg/dl  POCT glycosylated hemoglobin (Hb A1C)  Result Value Ref Range   Hemoglobin A1C 5.3      EKG/XRAY:   Primary read interpreted by Dr. Marin Comment at [facility].   ASSESSMENT/PLAN: Encounter Diagnoses  Name Primary?  . Paresthesia   . Paresthesia of both hands   . Paresthesia of both  feet   . Vaginal discharge   . Family history of hypercoagulability   . Other fatigue   . Bacterial vaginosis Yes   Katelyn [name] is a pleasant 39 year old female who is here for acute on chronic bilateral hand  and feet paresthesias off and on for several months.  She denies carpal tunnel or PF, but I suspect this may be a contributing factor She is here because she is worried she may have a clot of something similar to waht her 22 year old daughter had before she died suddenly, her daughter complained of left arm pain and then suddenly died a couple of days later and the doctors per the patient do not know the cause. They think she may have had a stroke, o clot but studies did not show anything and she did not have an autopsy.  She also has +/- BV but does not want to treat at this time. Will try probiotics and see if the odor goes away, adivise to try clothes that are cotton, wick  And are not tight Labs pending: basic coag studies to ease her mind, CMP and also TSH.  Fu with labs and see how her sxs are relieved with ROm and exercises   Gross sideeffects, risk and benefits, and alternatives of medications d/w patient. Patient is aware that all medications have potential sideeffects and we are unable to predict every sideeffect or drug-drug interaction that may occur.  LE, Ducor, DO 09/30/2014 10:49 AM

## 2014-09-30 LAB — PROTIME-INR
INR: 1.05
Prothrombin Time: 13.7 s (ref 11.6–15.2)

## 2014-10-01 LAB — COMPLETE METABOLIC PANEL WITHOUT GFR
ALT: <8 U/L (ref 0–35)
Albumin: 4.5 g/dL (ref 3.5–5.2)
BUN: 14 mg/dL (ref 6–23)
Calcium: 9.7 mg/dL (ref 8.4–10.5)
Chloride: 101 meq/L (ref 96–112)
Creat: 0.8 mg/dL (ref 0.50–1.10)
Glucose, Bld: 84 mg/dL (ref 70–99)
Potassium: 4.1 meq/L (ref 3.5–5.3)
Sodium: 136 meq/L (ref 135–145)

## 2014-10-01 LAB — PROTEIN C, TOTAL: Protein C, Total: 75 % (ref 72–160)

## 2014-10-01 LAB — COMPLETE METABOLIC PANEL WITH GFR
AST: 13 U/L (ref 0–37)
Alkaline Phosphatase: 103 U/L (ref 39–117)
CO2: 28 mEq/L (ref 19–32)
GFR, Est African American: >89 mL/min
GFR, Est Non African American: >89 mL/min
Total Bilirubin: 0.5 mg/dL (ref 0.2–1.2)
Total Protein: 7.5 g/dL (ref 6.0–8.3)

## 2014-10-01 LAB — ANTITHROMBIN III: AntiThromb III Func: 105 % (ref 76–126)

## 2014-10-01 LAB — TSH: TSH: 0.894 u[IU]/mL (ref 0.350–4.500)

## 2014-10-03 ENCOUNTER — Telehealth: Payer: Self-pay | Admitting: Family Medicine

## 2014-10-03 ENCOUNTER — Telehealth: Payer: Self-pay

## 2014-10-03 LAB — FACTOR 5 LEIDEN

## 2014-10-03 NOTE — Telephone Encounter (Signed)
Pt called to return a call from Dr. Marin Comment. Please advise. CB # [phone]

## 2014-10-03 NOTE — Telephone Encounter (Signed)
LM about prelim labs, chekcing in to see how she is doing. She can either call back tomorrow when I am working or enroll in [facility].

## 2014-10-04 NOTE — Telephone Encounter (Signed)
Pt called in returning Dr Gus Puma phone call. She can be reached [phone]. Thank you

## 2014-10-06 LAB — ANTIPHOSPHOLIPID SYNDROME EVAL, BLD
Anticardiolipin IgA: 3 APL U/mL (ref <22)
Anticardiolipin IgG: 32 GPL U/mL — ABNORMAL HIGH (ref <23)
Anticardiolipin IgM: 0 [MPL'U]/mL (ref <11)
DRVVT: 29.9 s (ref <42.9)
Lupus Anticoagulant: NOT DETECTED
PTT Lupus Anticoagulant: 35.7 secs (ref 28.0–43.0)
Phosphatidylserine IgG Autoantibodies: 6 U/mL (ref <16)
Phosphatydalserine, IgA: 6 U/mL (ref <20)
Phosphatydalserine, IgM: 4 U/mL (ref <22)

## 2014-10-06 NOTE — Telephone Encounter (Signed)
Spoke with pateint about labs. She thinks she got sick sitting n our office for 5 hours. Has laryngiti and what sounds like URI sxs. F/u prn

## 2014-10-13 ENCOUNTER — Encounter: Payer: Self-pay | Admitting: Family Medicine

## 2015-11-17 ENCOUNTER — Ambulatory Visit (INDEPENDENT_AMBULATORY_CARE_PROVIDER_SITE_OTHER): Payer: BC Managed Care – PPO | Admitting: Women's Health

## 2015-11-17 ENCOUNTER — Encounter: Payer: Self-pay | Admitting: Women's Health

## 2015-11-17 DIAGNOSIS — A499 Bacterial infection, unspecified: Secondary | ICD-10-CM

## 2015-11-17 DIAGNOSIS — R350 Frequency of micturition: Secondary | ICD-10-CM

## 2015-11-17 DIAGNOSIS — B9689 Other specified bacterial agents as the cause of diseases classified elsewhere: Secondary | ICD-10-CM

## 2015-11-17 DIAGNOSIS — Z8241 Family history of sudden cardiac death: Secondary | ICD-10-CM

## 2015-11-17 DIAGNOSIS — N898 Other specified noninflammatory disorders of vagina: Secondary | ICD-10-CM

## 2015-11-17 DIAGNOSIS — N760 Acute vaginitis: Secondary | ICD-10-CM

## 2015-11-17 LAB — URINALYSIS W MICROSCOPIC + REFLEX CULTURE
Bilirubin Urine: NEGATIVE
Casts: NONE SEEN [LPF]
Crystals: NONE SEEN [HPF]
Glucose, UA: NEGATIVE
Ketones, ur: NEGATIVE
Leukocytes, UA: NEGATIVE
Nitrite: NEGATIVE
Protein, ur: NEGATIVE
Specific Gravity, Urine: 1.015 (ref 1.001–1.035)
Yeast: NONE SEEN [HPF]
pH: 5.5 (ref 5.0–8.0)

## 2015-11-17 LAB — WET PREP FOR TRICH, YEAST, CLUE
Trich, Wet Prep: NONE SEEN
Yeast Wet Prep HPF POC: NONE SEEN

## 2015-11-17 MED ORDER — METRONIDAZOLE 500 MG PO TABS: 500.0000 mg | ORAL_TABLET | Freq: Two times a day (BID) | ORAL | Status: DC

## 2015-11-17 NOTE — Progress Notes (Signed)
Patient ID: Katelyn [name], female   DOB: [birth year], 40 y.o.   MRN: [id number] Presents with complaint of increased vaginal discharge with questionable odor. Denies vaginal itching, abdominal pain, urinary symptoms, or fever. Recently returned from a breech vacation. Same partner. TAH with BSO for fibroids. Only child,  Katelyn [name], healthy died before her seventh birthdate sudden death from stroke.  Name: Appears well. Abdomen soft, obese, nontender. External genitalia within normal limits, speculum exam scant discharge without odor, wet prep positive for few clues, TNTC bacteria. Bimanual nontender.  UA: Trace blood, 0-5 WBCs, 0-2 RBCs, TNTC bacteria, 10-20 squamous epithelials  Bacterial vaginosis  Plan: Flagyl 500 twice daily for 7 days, alcohol precautions reviewed, instructed to call if no relief of discharge. Urine culture pending.

## 2015-11-17 NOTE — Patient Instructions (Signed)

## 2015-11-19 LAB — URINE CULTURE

## 2015-12-01 ENCOUNTER — Ambulatory Visit (INDEPENDENT_AMBULATORY_CARE_PROVIDER_SITE_OTHER): Payer: BC Managed Care – PPO | Admitting: Women's Health

## 2015-12-01 ENCOUNTER — Encounter: Payer: Self-pay | Admitting: Women's Health

## 2015-12-01 VITALS — BP 126/80 | Ht 65.0 in | Wt 213.0 lb

## 2015-12-01 DIAGNOSIS — Z1329 Encounter for screening for other suspected endocrine disorder: Secondary | ICD-10-CM

## 2015-12-01 DIAGNOSIS — Z01419 Encounter for gynecological examination (general) (routine) without abnormal findings: Secondary | ICD-10-CM

## 2015-12-01 DIAGNOSIS — Z833 Family history of diabetes mellitus: Secondary | ICD-10-CM | POA: Diagnosis not present

## 2015-12-01 DIAGNOSIS — Z13220 Encounter for screening for lipoid disorders: Secondary | ICD-10-CM

## 2015-12-01 LAB — CBC WITH DIFFERENTIAL/PLATELET
BASOS PCT: 1 %
Basophils Absolute: 65 cells/uL (ref 0–200)
EOS PCT: 2 %
Eosinophils Absolute: 130 cells/uL (ref 15–500)
HCT: 41.4 % (ref 35.0–45.0)
Hemoglobin: 13.6 g/dL (ref 11.7–15.5)
Lymphocytes Relative: 29 %
Lymphs Abs: 1885 cells/uL (ref 850–3900)
MCH: 29.3 pg (ref 27.0–33.0)
MCHC: 32.9 g/dL (ref 32.0–36.0)
MCV: 89.2 fL (ref 80.0–100.0)
MPV: 8.8 fL (ref 7.5–12.5)
Monocytes Absolute: 455 cells/uL (ref 200–950)
Monocytes Relative: 7 %
NEUTROS ABS: 3965 {cells}/uL (ref 1500–7800)
Neutrophils Relative %: 61 %
PLATELETS: 328 10*3/uL (ref 140–400)
RBC: 4.64 MIL/uL (ref 3.80–5.10)
RDW: 14.2 % (ref 11.0–15.0)
WBC: 6.5 10*3/uL (ref 3.8–10.8)

## 2015-12-01 LAB — COMPREHENSIVE METABOLIC PANEL
ALK PHOS: 89 U/L (ref 33–115)
ALT: 9 U/L (ref 6–29)
AST: 12 U/L (ref 10–30)
Albumin: 4.3 g/dL (ref 3.6–5.1)
BUN: 14 mg/dL (ref 7–25)
CHLORIDE: 104 mmol/L (ref 98–110)
CO2: 24 mmol/L (ref 20–31)
Calcium: 9 mg/dL (ref 8.6–10.2)
Creat: 0.84 mg/dL (ref 0.50–1.10)
Glucose, Bld: 76 mg/dL (ref 65–99)
Potassium: 4.7 mmol/L (ref 3.5–5.3)
Sodium: 140 mmol/L (ref 135–146)
TOTAL PROTEIN: 7.2 g/dL (ref 6.1–8.1)
Total Bilirubin: 0.5 mg/dL (ref 0.2–1.2)

## 2015-12-01 LAB — LIPID PANEL
Cholesterol: 222 mg/dL — ABNORMAL HIGH (ref 125–200)
HDL: 44 mg/dL — AB (ref >=46)
LDL Cholesterol: 152 mg/dL — ABNORMAL HIGH (ref <130)
TRIGLYCERIDES: 129 mg/dL (ref <150)
Total CHOL/HDL Ratio: 5 Ratio (ref <=5.0)
VLDL: 26 mg/dL (ref <30)

## 2015-12-01 NOTE — Progress Notes (Signed)
KHRISTIAN ARTIGA [birth year] [id number]    History:    Presents for annual exam.  Presents with complaint of increased thirst, states is drinking more causing her to have to get up at night to go to the bathroom. History of a TAH with BS for fibroids. Has not had a screening mammogram. Same partner years. Having some low back discomfort and leg achiness most likely caused from new exercise routine. History of GDM.  Past medical history, past surgical history, family history and social history were all reviewed and documented in the EPIC chart. Works at [employer]. [id number]  103 year old all daughter [name] died  from a stroke.  ROS:  A ROS was performed and pertinent positives and negatives are included.  Exam:  Filed Vitals:   12/01/15 1206  BP: 126/80    General appearance:  Normal Thyroid:  Symmetrical, normal in size, without palpable masses or nodularity. Respiratory  Auscultation:  Clear without wheezing or rhonchi Cardiovascular  Auscultation:  Regular rate, without rubs, murmurs or gallops  Edema/varicosities:  Not grossly evident Abdominal  Soft,nontender, without masses, guarding or rebound.  Liver/spleen:  No organomegaly noted  Hernia:  None appreciated  Skin  Inspection:  Grossly normal   Breasts: Examined lying and sitting.     Right: Without masses, retractions, discharge or axillary adenopathy.     Left: Without masses, retractions, discharge or axillary adenopathy. Gentitourinary   Inguinal/mons:  Normal without inguinal adenopathy  External genitalia:  Normal  BUS/Urethra/Skene's glands:  Normal  Vagina:  Normal  Cervix: Uterus Absent  Adnexa/parametria:     Rt: Without masses or tenderness.   Lt: Without masses or tenderness.  Anus and perineum: Normal  Digital rectal exam: Normal sphincter tone without palpated masses or tenderness  Assessment/Plan:  40 y.o. SBF G1 P1/deceased for annual exam with complaint of increased thirst causing urinary  frequency/nocturia.  TVH with BS-fibroids Obesity  Plan: Continue exercise regimen, decrease carbs in diet, decrease fluids in the evening. CBC, lipid panel, TSH, CMP, hemoglobin A1c, UA. Normal Pap history, new screening guidelines reviewed. Screening annual mammogram reviewed and encouraged to schedule,  breast center information given.    Huel C[facility], 12:45 PM 12/01/2015

## 2015-12-01 NOTE — Patient Instructions (Addendum)
Basic Carbohydrate Counting for Diabetes Mellitus Carbohydrate counting is a method for keeping track of the amount of carbohydrates you eat. Eating carbohydrates naturally increases the level of sugar (glucose) in your blood, so it is important for you to know the amount that is okay for you to have in every meal. Carbohydrate counting helps keep the level of glucose in your blood within normal limits. The amount of carbohydrates allowed is different for every person. A dietitian can help you calculate the amount that is right for you. Once you know the amount of carbohydrates you can have, you can count the carbohydrates in the foods you want to eat. Carbohydrates are found in the following foods:  Grains, such as breads and cereals.  Dried beans and soy products.  Starchy vegetables, such as potatoes, peas, and corn.  Fruit and fruit juices.  Milk and yogurt.  Sweets and snack foods, such as cake, cookies, candy, chips, soft drinks, and fruit drinks. CARBOHYDRATE COUNTING There are two ways to count the carbohydrates in your food. You can use either of the methods or a combination of both. Reading the "Nutrition Facts" on [place] The "Nutrition Facts" is an area that is included on the labels of almost all packaged food and beverages in the [place]. It includes the serving size of that food or beverage and information about the nutrients in each serving of the food, including the grams (g) of carbohydrate per serving.  Decide the number of servings of this food or beverage that you will be able to eat or drink. Multiply that number of servings by the number of grams of carbohydrate that is listed on the label for that serving. The total will be the amount of carbohydrates you will be having when you eat or drink this food or beverage. Learning Standard Serving Sizes of Food When you eat food that is not packaged or does not include "Nutrition Facts" on the label, you need to  measure the servings in order to count the amount of carbohydrates.A serving of most carbohydrate-rich foods contains about 15 g of carbohydrates. The following list includes serving sizes of carbohydrate-rich foods that provide 15 g ofcarbohydrate per serving:   1 slice of bread (1 oz) or 1 six-inch tortilla.    of a hamburger bun or English muffin.  4-6 crackers.   cup unsweetened dry cereal.    cup hot cereal.   cup rice or pasta.    cup mashed potatoes or  of a large baked potato.  1 cup fresh fruit or one small piece of fruit.    cup canned or frozen fruit or fruit juice.  1 cup milk.   cup plain fat-free yogurt or yogurt sweetened with artificial sweeteners.   cup cooked dried beans or starchy vegetable, such as peas, corn, or potatoes.  Decide the number of standard-size servings that you will eat. Multiply that number of servings by 15 (the grams of carbohydrates in that serving). For example, if you eat 2 cups of strawberries, you will have eaten 2 servings and 30 g of carbohydrates (2 servings x 15 g = 30 g). For foods such as soups and casseroles, in which more than one food is mixed in, you will need to count the carbohydrates in each food that is included. EXAMPLE OF CARBOHYDRATE COUNTING Sample Dinner  3 oz chicken breast.   cup of brown rice.   cup of corn.  1 cup milk.   1 cup strawberries with  sugar-free whipped topping.  Carbohydrate Calculation Step 1: Identify the foods that contain carbohydrates:   Rice.   Corn.   Milk.   Strawberries. Step 2:Calculate the number of servings eaten of each:   2 servings of rice.   1 serving of corn.   1 serving of milk.   1 serving of strawberries. Step 3: Multiply each of those number of servings by 15 g:   2 servings of rice x 15 g = 30 g.   1 serving of corn x 15 g = 15 g.   1 serving of milk x 15 g = 15 g.   1 serving of strawberries x 15 g = 15 g. Step 4: Add  together all of the amounts to find the total grams of carbohydrates eaten: 30 g + 15 g + 15 g + 15 g = 75 g.   This information is not intended to replace advice given to you by your health care provider. Make sure you discuss any questions you have with your health care provider.   Document Released: 08/14/2005 Document Revised: 09/04/2014 Document Reviewed: 07/11/2013 Elsevier Interactive Patient Education 2016 [employer]. [facility] [phone] Health Maintenance, Female Adopting a healthy lifestyle and getting preventive care can go a long way to promote health and wellness. Talk with your health care provider about what schedule of regular examinations is right for you. This is a good chance for you to check in with your provider about disease prevention and staying healthy. In between checkups, there are plenty of things you can do on your own. Experts have done a lot of research about which lifestyle changes and preventive measures are most likely to keep you healthy. Ask your health care provider for more information. WEIGHT AND DIET  Eat a healthy diet  Be sure to include plenty of vegetables, fruits, low-fat dairy products, and lean protein.  Do not eat a lot of foods high in solid fats, added sugars, or salt.  Get regular exercise. This is one of the most important things you can do for your health.  Most adults should exercise for at least 150 minutes each week. The exercise should increase your heart rate and make you sweat (moderate-intensity exercise).  Most adults should also do strengthening exercises at least twice a week. This is in addition to the moderate-intensity exercise.  Maintain a healthy weight  Body mass index (BMI) is a measurement that can be used to identify possible weight problems. It estimates body fat based on height and weight. Your health care provider can help determine your BMI and help you achieve or maintain a healthy weight.  For  females 58 years of age and older:   A BMI below 18.5 is considered underweight.  A BMI of 18.5 to 24.9 is normal.  A BMI of 25 to 29.9 is considered overweight.  A BMI of 30 and above is considered obese.  Watch levels of cholesterol and blood lipids  You should start having your blood tested for lipids and cholesterol at 42 years of age, then have this test every 5 years.  You may need to have your cholesterol levels checked more often if:  Your lipid or cholesterol levels are high.  You are older than 42 years of age.  You are at high risk for heart disease.  CANCER SCREENING   Lung Cancer  Lung cancer screening is recommended for adults 33-28 years old who are at high risk for lung cancer  because of a history of smoking.  A yearly low-dose CT scan of the lungs is recommended for people who:  Currently smoke.  Have quit within the past 15 years.  Have at least a 30-pack-year history of smoking. A pack year is smoking an average of one pack of cigarettes a day for 1 year.  Yearly screening should continue until it has been 15 years since you quit.  Yearly screening should stop if you develop a health problem that would prevent you from having lung cancer treatment.  Breast Cancer  Practice breast self-awareness. This means understanding how your breasts normally appear and feel.  It also means doing regular breast self-exams. Let your health care provider know about any changes, no matter how small.  If you are in your 20s or 30s, you should have a clinical breast exam (CBE) by a health care provider every 1-3 years as part of a regular health exam.  If you are 65 or older, have a CBE every year. Also consider having a breast X-ray (mammogram) every year.  If you have a family history of breast cancer, talk to your health care provider about genetic screening.  If you are at high risk for breast cancer, talk to your health care provider about having an MRI and  a mammogram every year.  Breast cancer gene (BRCA) assessment is recommended for women who have family members with BRCA-related cancers. BRCA-related cancers include:  Breast.  Ovarian.  Tubal.  Peritoneal cancers.  Results of the assessment will determine the need for genetic counseling and BRCA1 and BRCA2 testing. Cervical Cancer Your health care provider may recommend that you be screened regularly for cancer of the pelvic organs (ovaries, uterus, and vagina). This screening involves a pelvic examination, including checking for microscopic changes to the surface of your cervix (Pap test). You may be encouraged to have this screening done every 3 years, beginning at age 30.  For women ages 22-65, health care providers may recommend pelvic exams and Pap testing every 3 years, or they may recommend the Pap and pelvic exam, combined with testing for human papilloma virus (HPV), every 5 years. Some types of HPV increase your risk of cervical cancer. Testing for HPV may also be done on women of any age with unclear Pap test results.  Other health care providers may not recommend any screening for nonpregnant women who are considered low risk for pelvic cancer and who do not have symptoms. Ask your health care provider if a screening pelvic exam is right for you.  If you have had past treatment for cervical cancer or a condition that could lead to cancer, you need Pap tests and screening for cancer for at least 20 years after your treatment. If Pap tests have been discontinued, your risk factors (such as having a new sexual partner) need to be reassessed to determine if screening should resume. Some women have medical problems that increase the chance of getting cervical cancer. In these cases, your health care provider may recommend more frequent screening and Pap tests. Colorectal Cancer  This type of cancer can be detected and often prevented.  Routine colorectal cancer screening usually  begins at 42 years of age and continues through 42 years of age.  Your health care provider may recommend screening at an earlier age if you have risk factors for colon cancer.  Your health care provider may also recommend using home test kits to check for hidden blood in the stool.  A  small camera at the end of a tube can be used to examine your colon directly (sigmoidoscopy or colonoscopy). This is done to check for the earliest forms of colorectal cancer.  Routine screening usually begins at age 58.  Direct examination of the colon should be repeated every 5-10 years through 42 years of age. However, you may need to be screened more often if early forms of precancerous polyps or small growths are found. Skin Cancer  Check your skin from head to toe regularly.  Tell your health care provider about any new moles or changes in moles, especially if there is a change in a mole's shape or color.  Also tell your health care provider if you have a mole that is larger than the size of a pencil eraser.  Always use sunscreen. Apply sunscreen liberally and repeatedly throughout the day.  Protect yourself by wearing long sleeves, pants, a wide-brimmed hat, and sunglasses whenever you are outside. HEART DISEASE, DIABETES, AND HIGH BLOOD PRESSURE   High blood pressure causes heart disease and increases the risk of stroke. High blood pressure is more likely to develop in:  People who have blood pressure in the high end of the normal range (130-139/85-89 mm Hg).  People who are overweight or obese.  People who are African American.  If you are 15-35 years of age, have your blood pressure checked every 3-5 years. If you are 56 years of age or older, have your blood pressure checked every year. You should have your blood pressure measured twice--once when you are at a hospital or clinic, and once when you are not at a hospital or clinic. Record the average of the two measurements. To check your blood  pressure when you are not at a hospital or clinic, you can use:  An automated blood pressure machine at a pharmacy.  A home blood pressure monitor.  If you are between 33 years and 41 years old, ask your health care provider if you should take aspirin to prevent strokes.  Have regular diabetes screenings. This involves taking a blood sample to check your fasting blood sugar level.  If you are at a normal weight and have a low risk for diabetes, have this test once every three years after 42 years of age.  If you are overweight and have a high risk for diabetes, consider being tested at a younger age or more often. PREVENTING INFECTION  Hepatitis B  If you have a higher risk for hepatitis B, you should be screened for this virus. You are considered at high risk for hepatitis B if:  You were born in a country where hepatitis B is common. Ask your health care provider which countries are considered high risk.  Your parents were born in a high-risk country, and you have not been immunized against hepatitis B (hepatitis B vaccine).  You have HIV or AIDS.  You use needles to inject street drugs.  You live with someone who has hepatitis B.  You have had sex with someone who has hepatitis B.  You get hemodialysis treatment.  You take certain medicines for conditions, including cancer, organ transplantation, and autoimmune conditions. Hepatitis C  Blood testing is recommended for:  Everyone born from [birth year] through 1967.  Anyone with known risk factors for hepatitis C. Sexually transmitted infections (STIs)  You should be screened for sexually transmitted infections (STIs) including gonorrhea and chlamydia if:  You are sexually active and are younger than 42 years of age.  You are older than 42 years of age and your health care provider tells you that you are at risk for this type of infection.  Your sexual activity has changed since you were last screened and you are at an  increased risk for chlamydia or gonorrhea. Ask your health care provider if you are at risk.  If you do not have HIV, but are at risk, it may be recommended that you take a prescription medicine daily to prevent HIV infection. This is called pre-exposure prophylaxis (PrEP). You are considered at risk if:  You are sexually active and do not regularly use condoms or know the HIV status of your partner(s).  You take drugs by injection.  You are sexually active with a partner who has HIV. Talk with your health care provider about whether you are at high risk of being infected with HIV. If you choose to begin PrEP, you should first be tested for HIV. You should then be tested every 3 months for as long as you are taking PrEP.  PREGNANCY   If you are premenopausal and you may become pregnant, ask your health care provider about preconception counseling.  If you may become pregnant, take 400 to 800 micrograms (mcg) of folic acid every day.  If you want to prevent pregnancy, talk to your health care provider about birth control (contraception). OSTEOPOROSIS AND MENOPAUSE   Osteoporosis is a disease in which the bones lose minerals and strength with aging. This can result in serious bone fractures. Your risk for osteoporosis can be identified using a bone density scan.  If you are 75 years of age or older, or if you are at risk for osteoporosis and fractures, ask your health care provider if you should be screened.  Ask your health care provider whether you should take a calcium or vitamin D supplement to lower your risk for osteoporosis.  Menopause may have certain physical symptoms and risks.  Hormone replacement therapy may reduce some of these symptoms and risks. Talk to your health care provider about whether hormone replacement therapy is right for you.  HOME CARE INSTRUCTIONS   Schedule regular health, dental, and eye exams.  Stay current with your immunizations.   Do not use any  tobacco products including cigarettes, chewing tobacco, or electronic cigarettes.  If you are pregnant, do not drink alcohol.  If you are breastfeeding, limit how much and how often you drink alcohol.  Limit alcohol intake to no more than 1 drink per day for nonpregnant women. One drink equals 12 ounces of beer, 5 ounces of wine, or 1 ounces of hard liquor.  Do not use street drugs.  Do not share needles.  Ask your health care provider for help if you need support or information about quitting drugs.  Tell your health care provider if you often feel depressed.  Tell your health care provider if you have ever been abused or do not feel safe at home.   This information is not intended to replace advice given to you by your health care provider. Make sure you discuss any questions you have with your health care provider.   Document Released: 02/27/2011 Document Revised: 09/04/2014 Document Reviewed: 07/16/2013 Elsevier Interactive Patient Education [employer].

## 2015-12-02 ENCOUNTER — Other Ambulatory Visit: Payer: Self-pay | Admitting: Women's Health

## 2015-12-02 DIAGNOSIS — R7303 Prediabetes: Secondary | ICD-10-CM

## 2015-12-02 DIAGNOSIS — E7800 Pure hypercholesterolemia, unspecified: Secondary | ICD-10-CM

## 2015-12-02 LAB — URINALYSIS W MICROSCOPIC + REFLEX CULTURE
BACTERIA UA: NONE SEEN [HPF]
BILIRUBIN URINE: NEGATIVE
CASTS: NONE SEEN [LPF]
CRYSTALS: NONE SEEN [HPF]
Glucose, UA: NEGATIVE
Hgb urine dipstick: NEGATIVE
KETONES UR: NEGATIVE
Leukocytes, UA: NEGATIVE
Nitrite: NEGATIVE
PROTEIN: NEGATIVE
RBC / HPF: NONE SEEN RBC/HPF (ref <=2)
Specific Gravity, Urine: 1.022 (ref 1.001–1.035)
WBC UA: NONE SEEN WBC/HPF (ref <=5)
Yeast: NONE SEEN [HPF]
pH: 6.5 (ref 5.0–8.0)

## 2015-12-02 LAB — HEMOGLOBIN A1C
Hgb A1c MFr Bld: 6 % — ABNORMAL HIGH (ref <5.7)
Mean Plasma Glucose: 126 mg/dL

## 2015-12-02 LAB — TSH: TSH: 1.53 m[IU]/L

## 2016-04-03 ENCOUNTER — Other Ambulatory Visit: Payer: Self-pay

## 2016-06-21 ENCOUNTER — Emergency Department (HOSPITAL_COMMUNITY)
Admission: EM | Admit: 2016-06-21 | Discharge: 2016-06-21 | Disposition: A | Discharge status: Home / Self Care | Payer: BC Managed Care – PPO | Attending: Emergency Medicine | Admitting: Emergency Medicine

## 2016-06-21 ENCOUNTER — Encounter (HOSPITAL_COMMUNITY): Payer: Self-pay

## 2016-06-21 DIAGNOSIS — Z87891 Personal history of nicotine dependence: Secondary | ICD-10-CM | POA: Diagnosis not present

## 2016-06-21 DIAGNOSIS — G629 Polyneuropathy, unspecified: Secondary | ICD-10-CM | POA: Insufficient documentation

## 2016-06-21 DIAGNOSIS — R200 Anesthesia of skin: Secondary | ICD-10-CM | POA: Diagnosis present

## 2016-06-21 LAB — BASIC METABOLIC PANEL
ANION GAP: 8 (ref 5–15)
BUN: 13 mg/dL (ref 6–20)
CALCIUM: 9 mg/dL (ref 8.9–10.3)
CHLORIDE: 107 mmol/L (ref 101–111)
CO2: 23 mmol/L (ref 22–32)
Creatinine, Ser: 0.88 mg/dL (ref 0.44–1.00)
GFR calc non Af Amer: >60 mL/min (ref >60)
Glucose, Bld: 114 mg/dL — ABNORMAL HIGH (ref 65–99)
POTASSIUM: 3.9 mmol/L (ref 3.5–5.1)
Sodium: 138 mmol/L (ref 135–145)

## 2016-06-21 LAB — CBC WITH DIFFERENTIAL/PLATELET
BASOS ABS: 0 10*3/uL (ref 0.0–0.1)
BASOS PCT: 0 %
Eosinophils Absolute: 0.2 10*3/uL (ref 0.0–0.7)
Eosinophils Relative: 3 %
HEMATOCRIT: 39.5 % (ref 36.0–46.0)
HEMOGLOBIN: 13.1 g/dL (ref 12.0–15.0)
LYMPHS PCT: 22 %
Lymphs Abs: 1.6 10*3/uL (ref 0.7–4.0)
MCH: 29.6 pg (ref 26.0–34.0)
MCHC: 33.2 g/dL (ref 30.0–36.0)
MCV: 89.2 fL (ref 78.0–100.0)
Monocytes Absolute: 0.5 10*3/uL (ref 0.1–1.0)
Monocytes Relative: 7 %
NEUTROS ABS: 4.9 10*3/uL (ref 1.7–7.7)
NEUTROS PCT: 68 %
Platelets: 284 10*3/uL (ref 150–400)
RBC: 4.43 MIL/uL (ref 3.87–5.11)
RDW: 13.8 % (ref 11.5–15.5)
WBC: 7.1 10*3/uL (ref 4.0–10.5)

## 2016-06-21 LAB — I-STAT TROPONIN, ED: Troponin i, poc: 0 ng/mL (ref 0.00–0.08)

## 2016-06-21 LAB — CBG MONITORING, ED: GLUCOSE-CAPILLARY: 106 mg/dL — AB (ref 65–99)

## 2016-06-21 NOTE — ED Notes (Signed)
PLACED PATIENT INTO A GOWN AND ON THE MONTOR

## 2016-06-21 NOTE — ED Notes (Signed)
Checked patient blood sugar it was 106 notified RN of blood sugar

## 2016-06-21 NOTE — ED Notes (Signed)
Patient states she has been having numbness and tingling in her right arm x 1 month. States it is worse at night. Bounding right radial pulse and equal strong grips bilaterally. No history of injury

## 2016-06-21 NOTE — ED Provider Notes (Signed)
[facility] DEPT Provider Note   CSN: [id number] Arrival date & time: 06/21/16  1105     History   Chief Complaint Chief Complaint  Patient presents with  . hand and arm numbness    HPI Katelyn [name] is a 40 y.o. female.  Patient presents to the emergency department with chief complaint of bilateral hand and feet numbness. She states that she has been having intermittent symptoms for the past 3 months. She states the symptoms have been progressively worsening. She states the last night the numbness and tingling became painful, and she called her primary care doctor, who referred her to the emergency department. Patient also reports having vision changes over the past several months. She states that she now has to use reading glasses. This is a significant change for her. She denies any slurred speech, ataxia, or weakness. She has not taken anything for her symptoms. There are no other associated symptoms.   The history is provided by the patient. No language interpreter was used.    Past Medical History:  Diagnosis Date  . Anemia   . Depression   . Fibroid   . Hx gestational diabetes    hx 2007  . Medical history non-contributory     Patient Active Problem List   Diagnosis Date Noted  . Family history of sudden cardiac death in daughter 2015/12/15  . Anemia 06/30/2013    Past Surgical History:  Procedure Laterality Date  . ABDOMINAL HYSTERECTOMY N/A 01/07/2014   Procedure: HYSTERECTOMY ABDOMINAL WITH BILATERAL SALPINGECTOMY;  Surgeon: Terrance Mass, MD;  Location: [facility];  Service: Gynecology;  Laterality: N/A;  Dr. Phineas Real to assist.  2nd choice will be 1:00pm on 01/07/14. Thanks!  . CESAREAN SECTION     x 1  . PELVIC LAPAROSCOPY  1993   cyst removed  . WISDOM TOOTH EXTRACTION      OB History    Gravida Para Term Preterm AB Living   1 1       1    SAB TAB Ectopic Multiple Live Births                   Home Medications    Prior to Admission  medications   Not on File    Family History Family History  Problem Relation Age of Onset  . Cancer Maternal Aunt     ovarian and uterine  . Breast cancer Maternal Aunt     unsure of age  . Diabetes Maternal Grandmother   . Hypertension Maternal Grandmother     Social History Social History  Substance Use Topics  . Smoking status: Former Smoker    Packs/day: 0.25    Years: 6.00    Types: Cigarettes    Quit date: 08/28/1997  . Smokeless tobacco: Never Used  . Alcohol use Yes     Comment: wine socially     Allergies   Latex   Review of Systems Review of Systems  Eyes: Positive for visual disturbance.  Neurological: Positive for numbness.  All other systems reviewed and are negative.    Physical Exam Updated Vital Signs BP 133/87 (BP Location: Right Arm)   Pulse 68   Temp 98.9 F (37.2 C) (Oral)   Resp 20   Ht 5' 5.5" (1.664 m)   Wt 97.5 kg   LMP 12/16/2013   SpO2 100%   BMI 35.23 kg/m   Physical Exam  Constitutional: She is oriented to person, place, and time. She appears well-developed and  well-nourished.  HENT:  Head: Normocephalic and atraumatic.  Eyes: Conjunctivae and EOM are normal. Pupils are equal, round, and reactive to light.  No clear palpilledema   Neck: Normal range of motion. Neck supple.  Cardiovascular: Normal rate and regular rhythm.  Exam reveals no gallop and no friction rub.   No murmur heard. Pulmonary/Chest: Effort normal and breath sounds normal. No respiratory distress. She has no wheezes. She has no rales. She exhibits no tenderness.  Abdominal: Soft. Bowel sounds are normal. She exhibits no distension and no mass. There is no tenderness. There is no rebound and no guarding.  Musculoskeletal: Normal range of motion. She exhibits no edema or tenderness.  Moves all extremities  Neurological: She is alert and oriented to person, place, and time.  CN 3-12 intact Speech is clear Movements are goal oriented Sensation and strength  intact throughout Negative Phalen/Tinel  Skin: Skin is warm and dry.  Psychiatric: She has a normal mood and affect. Her behavior is normal. Judgment and thought content normal.  Nursing note and vitals reviewed.    ED Treatments / Results  Labs (all labs ordered are listed, but only abnormal results are displayed) Labs Reviewed  CBC WITH DIFFERENTIAL/PLATELET  BASIC METABOLIC PANEL  CBG MONITORING, ED  I-STAT TROPOININ, ED    EKG  EKG Interpretation None       Radiology No results found.  Procedures Procedures (including critical care time)  Medications Ordered in ED Medications - No data to display   Initial Impression / Assessment and Plan / ED Course  I have reviewed the triage vital signs and the nursing notes.  Pertinent labs & imaging results that were available during my care of the patient were reviewed by me and considered in my medical decision making (see chart for details).  Clinical Course    Patient with stocking-glove pattern paresthesias and vision changes.  Symptoms have been ongoing for several months.  They are intermittent.    Labs are reassuring.  Patient seen by and discussed with Dr. Ashok Cordia, who states that patient can be discharged with close outpatient follow-up.  Would expect symptoms to be gradually progressive weakness if Mardelle Matte.  She has no weakness.  Also consider MS, but emergent MRI not felt to be indicated now, patient can follow-up with neurology on an outpatient basis.  I have provided her with information to get in touch with neurology for further workup.  Final Clinical Impressions(s) / ED Diagnoses   Final diagnoses:  Polyneuropathy [facility])     New Prescriptions New Prescriptions   No medications on file     Montine Circle, PA-C 06/21/16 [place], MD 06/21/16 1350

## 2016-06-21 NOTE — ED Notes (Signed)
No real c/o of pain just the tingling in her hand and wrist

## 2016-06-21 NOTE — ED Triage Notes (Signed)
Patient complains of several months of intermittent bilateral arm and hand numbness with bilateral foot numbness. States that it occasionally awakes her during the night with burning and pain. No neuro deficits on arrival, alert and oriented.

## 2016-07-27 ENCOUNTER — Ambulatory Visit (INDEPENDENT_AMBULATORY_CARE_PROVIDER_SITE_OTHER): Payer: BC Managed Care – PPO | Admitting: Neurology

## 2016-07-27 ENCOUNTER — Encounter: Payer: Self-pay | Admitting: Neurology

## 2016-07-27 VITALS — BP 110/70 | HR 84 | Ht 65.0 in | Wt 236.4 lb

## 2016-07-27 DIAGNOSIS — R7303 Prediabetes: Secondary | ICD-10-CM

## 2016-07-27 DIAGNOSIS — F4321 Adjustment disorder with depressed mood: Secondary | ICD-10-CM | POA: Diagnosis not present

## 2016-07-27 DIAGNOSIS — Z634 Disappearance and death of family member: Secondary | ICD-10-CM | POA: Diagnosis not present

## 2016-07-27 DIAGNOSIS — G5603 Carpal tunnel syndrome, bilateral upper limbs: Secondary | ICD-10-CM

## 2016-07-27 NOTE — Progress Notes (Signed)
[facility] Clinic Note - Initial Visit   Date: 07/27/16  Katelyn [name] MRN: [id number] DOB: [birth year]   Dear Dr. Chapman Fitch:  Thank you for your kind referral of [address] for consultation of generalized paresthesias. Although her history is well known to you, please allow [place] to reiterate it for the purpose of our medical record. The patient was accompanied to the clinic by [facility] who also provides collateral information.     History of Present Illness: Katelyn [name] is a 40 y.o. right-handed African American female with no prior medical history presenting for evaluation of bilateral hand pain and paresthesias.    Starting around late summer 2017, she began waking up at night because of bilateral hand pain, tingling of the thumb, and sensation of swelling.  She does not actually seen any increased swelling of the hands, but feels like she cannot move it, because it could "pop".  Pain usually lasts about 30-minutes and self-resolves. She has tried using a foam ball to grip and soft tissue massage, which helps.  She had deep burning sensation of the hands and achiness.  There are no triggers.    On one occasion, she developed a sesnation that her right arm was dislocated.  She also complains of spells of extreme hot flashes, frequent urination, and dry mouth. Her recent labs show mildly elevated HbA1c of 6.2 and she has not had a follow-up visit with her PCP to discuss management options.  She also reports having vision changes and was prescribed new eye glasses.     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  She also makes soap.  Upon further discussion, she disclosed that her daughter died at the age of 51 from a blood clot in her right arm that propagated to the lungs.  She is understandably very worried about her symptoms being they are also in the right arm.  She endorses grief related to the loss of her daughter and has been through  counseling and currently manages this by keeping busy.    Out-side paper records, electronic medical record, and images have been reviewed where available and summarized as:  Labs 06/26/2016:  HbA1c 6.2*, TSH 1.38, vitamin B12 741  Past Medical History:  Diagnosis Date  . Anemia   . Depression   . Fibroid   . Hx gestational diabetes    hx 2007  . Medical history non-contributory     Past Surgical History:  Procedure Laterality Date  . ABDOMINAL HYSTERECTOMY N/A 01/07/2014   Procedure: HYSTERECTOMY ABDOMINAL WITH BILATERAL SALPINGECTOMY;  Surgeon: Terrance Mass, MD;  Location: [facility];  Service: Gynecology;  Laterality: N/A;  Dr. Phineas Real to assist.  2nd choice will be 1:00pm on 01/07/14. Thanks!  . CESAREAN SECTION     x 1  . PELVIC LAPAROSCOPY  1993   cyst removed  . WISDOM TOOTH EXTRACTION       Medications:  No outpatient encounter prescriptions on file as of 07/27/2016.   No facility-administered encounter medications on file as of 07/27/2016.      Allergies:  Allergies  Allergen Reactions  . Latex Other (See Comments)    Burning with discharge with condom use    Family History: Family History  Problem Relation Age of Onset  . Cancer Maternal Aunt     ovarian and uterine  . Breast cancer Maternal Aunt     unsure of age  . Diabetes Maternal Grandmother   .  Hypertension Maternal Grandmother     Social History: Social History  Substance Use Topics  . Smoking status: Former Smoker    Packs/day: 0.25    Years: 6.00    Types: Cigarettes    Quit date: 08/28/1997  . Smokeless tobacco: Never Used  . Alcohol use Yes     Comment: wine socially   Social History   Social History Narrative  . No narrative on file    Review of Systems:  CONSTITUTIONAL: No fevers, chills, night sweats, or weight loss.   EYES: No visual changes or eye pain ENT: No hearing changes.  No history of nose bleeds.   RESPIRATORY: No cough, wheezing and shortness of breath.     CARDIOVASCULAR: Negative for chest pain, and palpitations.   GI: Negative for abdominal discomfort, blood in stools or black stools.  No recent change in bowel habits.   GU:  No history of incontinence.   MUSCLOSKELETAL: No history of joint pain or swelling.  No myalgias.   SKIN: Negative for lesions, rash, and itching.   HEMATOLOGY/ONCOLOGY: Negative for prolonged bleeding, bruising easily, and swollen nodes.  No history of cancer.   ENDOCRINE: Negative for cold or heat intolerance, polydipsia or goiter.   PSYCH:  +depression or anxiety symptoms.   NEURO: As Above.   Vital Signs:  BP 110/70   Pulse 84   Ht 5\' 5"  (1.651 m)   Wt 236 lb 7 oz (107.2 kg)   LMP 12/16/2013   SpO2 98%   BMI 39.35 kg/m  Pain Scale: 0 on a scale of 0-10   General Medical Exam:   General:  Well appearing, comfortable.   Eyes/ENT: see cranial nerve examination.   Neck: No masses appreciated.  Full range of motion without tenderness.  No carotid bruits. Respiratory:  Clear to auscultation, good air entry bilaterally.   Cardiac:  Regular rate and rhythm, no murmur.   Extremities:  No deformities, edema, or skin discoloration.  Skin:  No rashes or lesions.  Neurological Exam: MENTAL STATUS including orientation to time, place, person, recent and remote memory, attention span and concentration, language, and fund of knowledge is normal.  Speech is not dysarthric.  CRANIAL NERVES: II:  No visual field defects.  Unremarkable fundi.   III-IV-VI: Pupils equal round and reactive to light.  Normal conjugate, extra-ocular eye movements in all directions of gaze.  No nystagmus.  No ptosis.   V:  Normal facial sensation.     VII:  Normal facial symmetry and movements.  VIII:  Normal hearing and vestibular function.   IX-X:  Normal palatal movement.   XI:  Normal shoulder shrug and head rotation.   XII:  Normal tongue strength and range of motion, no deviation or fasciculation.  MOTOR:  No atrophy,  fasciculations or abnormal movements.  No pronator drift.  Tone is normal.    Right Upper Extremity:    Left Upper Extremity:    Deltoid  5/5   Deltoid  5/5   Biceps  5/5   Biceps  5/5   Triceps  5/5   Triceps  5/5   Wrist extensors  5/5   Wrist extensors  5/5   Wrist flexors  5/5   Wrist flexors  5/5   Finger extensors  5/5   Finger extensors  5/5   Finger flexors  5/5   Finger flexors  5/5   Dorsal interossei  5/5   Dorsal interossei  5/5   Abductor pollicis  5/5  Abductor pollicis  5/5   Tone (Ashworth scale)  0  Tone (Ashworth scale)  0   Right Lower Extremity:    Left Lower Extremity:    Hip flexors  5/5   Hip flexors  5/5   Hip extensors  5/5   Hip extensors  5/5   Knee flexors  5/5   Knee flexors  5/5   Knee extensors  5/5   Knee extensors  5/5   Dorsiflexors  5/5   Dorsiflexors  5/5   Plantarflexors  5/5   Plantarflexors  5/5   Toe extensors  5/5   Toe extensors  5/5   Toe flexors  5/5   Toe flexors  5/5   Tone (Ashworth scale)  0  Tone (Ashworth scale)  0   MSRs:  Right                                                                 Left brachioradialis 2+  brachioradialis 2+  biceps 2+  biceps 2+  triceps 2+  triceps 2+  patellar 2+  patellar 2+  ankle jerk 2+  ankle jerk 2+  Hoffman no  Hoffman no  plantar response down  plantar response down   SENSORY:  Normal and symmetric perception of light touch, pinprick, vibration, and proprioception.  Romberg's sign absent.  Tinels at the wrist and elbow is negative bilaterally.   COORDINATION/GAIT: Normal finger-to- nose-finger and heel-to-shin.  Intact rapid alternating movements bilaterally.  Able to rise from a chair without using arms.  Gait narrow based and stable. Tandem and stressed gait intact.    IMPRESSION: 1.  Bilateral hand pain and paresthesias, most suggestive of carpal tunnel syndrome, despite negative provocative tests on exam.   - TSH and vitamin B12 is normal  - NCS/EMG of the upper extremities   2.   Prediabetes (HbA1c 6.2) most likely explains her frequent urination and dry mouth.    - Recommended starting low sugar/carbohydrate diet and exercise program  - Follow-up with PCP for further instructions  3.  Grief due to loss of daughter  Further recommendations will be based on the results of her testing   The duration of this appointment visit was 45 minutes of face-to-face time with the patient.  Greater than 50% of this time was spent in counseling, explanation of diagnosis, planning of further management, and coordination of care.   Thank you for allowing me to participate in patient's care.  If I can answer any additional questions, I would be pleased to do so.    Sincerely,    Donika K. Posey Pronto, DO

## 2016-07-27 NOTE — Patient Instructions (Signed)
1.  NCS/EMG of the upper extremities 2.  Follow-up with your primary care doctor about prediabetes  We will call you with the results and inform you of the next step

## 2016-08-01 ENCOUNTER — Ambulatory Visit (INDEPENDENT_AMBULATORY_CARE_PROVIDER_SITE_OTHER): Payer: BC Managed Care – PPO | Admitting: Neurology

## 2016-08-01 DIAGNOSIS — G5603 Carpal tunnel syndrome, bilateral upper limbs: Secondary | ICD-10-CM

## 2016-08-01 NOTE — Procedures (Signed)
[facility]  [address], [address]  [place], [place] [address] Tel: [phone] Fax:  [phone] Test Date:  08/01/2016  Patient: Katelyn [name] DOB: 07-Aug-1976 Physician: Narda Amber, DO  Sex: Female Height: 5\' 5"  Ref Phys: Narda Amber, DO  ID#: [id number] Temp: 35.5C Technician: Jerilynn Mages. Dean   Patient Complaints: This is a 40 year-old female referred for evaluation of bilateral hand pain, paresthesias, and swelling.  NCV & EMG Findings: Extensive electrodiagnostic testing of the right upper extremity and additional studies of the left shows:  1. The right median sensory nerve showed prolonged distal peak latency (3.8 ms) and reduced amplitude (16.6 V). The left median sensory nerve showed prolonged distal peak latency (4.0 ms).  Bilateral ulnar sensory responses are within normal limits. 2. The right median motor nerve showed prolonged distal onset latency (5.3 ms) and reduced amplitude (5.6 mV).  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  Left median motor nerve showed prolonged distal onset latency (4.2 ms).  Bilateral ulnar motor responses are within normal limits. 3. There is no evidence of active or chronic motor axon loss changes affecting any of the tested muscles. Motor unit configuration and recruitment pattern is within normal limits.   Impression: 1. Bilateral median neuropathy at or distal to the wrist, consistent with the clinical diagnosis of carpal tunnel syndrome;  these findings are moderate in degree electrically. 2. Incidentally, there is a right Martin-Gruber anastomosis, a normal variant.    ___________________________ Narda Amber, DO    Nerve Conduction Studies Anti Sensory Summary Table   Site NR Peak (ms) Norm Peak (ms) P-T Amp (V) Norm  P-T Amp  Left Median Anti Sensory (2nd Digit)  35.5C  Wrist    4.0 <3.4 20.8 >20  Right Median Anti Sensory (2nd Digit)  35.5C  Wrist    3.8 <3.4 16.6 >20  Left Ulnar Anti Sensory (5th Digit)  35.5C  Wrist    2.9 <3.1 16.3 >12  Right Ulnar Anti Sensory (5th Digit)  35.5C  Wrist    2.7 <3.1 28.9 >12   Motor Summary Table   Site NR Onset (ms) Norm Onset (ms) O-P Amp (mV) Norm O-P Amp Site1 Site2 Delta-0 (ms) Dist (cm) Vel (m/s) Norm Vel (m/s)  Left Median Motor (Abd Poll Brev)  35.5C  Wrist    4.2 <3.9 7.4 >6 Elbow Wrist 4.0 23.0 58 >50  Elbow    8.2  6.8         Right Median Motor (Abd Poll Brev)  35.5C  Wrist    5.3 <3.9 5.6 >6 Elbow Wrist 3.7 23.0 62 >50  Elbow    9.0  2.5  Ulnar wrist crossover Elbow 5.2 0.0    Ulnar wrist crossover    3.8  4.7         Left Ulnar Motor (Abd Dig Minimi)  35.5C  Wrist    2.3 <3.1 8.7 >7 B Elbow Wrist 3.5 19.0 54 >50  B Elbow    5.8  8.5  A Elbow B Elbow 1.8 10.0 56 >50  A Elbow    7.6  8.3         Right Ulnar Motor (Abd Dig Minimi)  35.5C  Wrist    2.5 <3.1 9.2 >7 B Elbow Wrist 3.7 22.0 59 >50  B Elbow  6.2  8.6  A Elbow B Elbow 1.6 10.0 63 >50  A Elbow    7.8  8.6          EMG   Side Muscle Ins Act Fibs Psw Fasc Number Recrt Dur Dur. Amp Amp. Poly Poly. Comment  Right 1stDorInt Nml Nml Nml Nml Nml Nml Nml Nml Nml Nml Nml Nml N/A  Right Abd Poll Brev Nml Nml Nml Nml Nml Nml Nml Nml Nml Nml Nml Nml N/A  Right Ext Indicis Nml Nml Nml Nml Nml Nml Nml Nml Nml Nml Nml Nml N/A  Right PronatorTeres Nml Nml Nml Nml Nml Nml Nml Nml Nml Nml Nml Nml N/A  Right Biceps Nml Nml Nml Nml Nml Nml Nml Nml Nml Nml Nml Nml N/A  Right Triceps Nml Nml Nml Nml Nml Nml Nml Nml Nml Nml Nml Nml N/A  Right Deltoid Nml Nml Nml Nml Nml Nml Nml Nml Nml Nml Nml Nml N/A  Left 1stDorInt Nml Nml Nml Nml Nml Nml Nml Nml Nml Nml Nml Nml N/A  Left Abd Poll Brev Nml Nml Nml Nml Nml Nml Nml Nml Nml Nml Nml Nml N/A  Left Ext Indicis Nml Nml Nml Nml Nml Nml Nml Nml Nml Nml  Nml Nml N/A  Left PronatorTeres Nml Nml Nml Nml Nml Nml Nml Nml Nml Nml Nml Nml N/A  Left Biceps Nml Nml Nml Nml Nml Nml Nml Nml Nml Nml Nml Nml N/A  Left Triceps Nml Nml Nml Nml Nml Nml Nml Nml Nml Nml Nml Nml N/A  Left Deltoid Nml Nml Nml Nml Nml Nml Nml Nml Nml Nml Nml Nml N/A      Waveforms:

## 2016-08-03 ENCOUNTER — Telehealth: Payer: Self-pay | Admitting: Neurology

## 2016-08-03 NOTE — Telephone Encounter (Signed)
Katelyn [name] 1976/04/25. Her # [id number]. She was calling about test results. Thank you

## 2016-09-11 ENCOUNTER — Ambulatory Visit: Payer: BC Managed Care – PPO | Admitting: Neurology

## 2016-12-01 ENCOUNTER — Encounter: Payer: BC Managed Care – PPO | Admitting: Women's Health

## 2016-12-04 ENCOUNTER — Encounter: Payer: Self-pay | Admitting: Women's Health

## 2016-12-04 ENCOUNTER — Ambulatory Visit (INDEPENDENT_AMBULATORY_CARE_PROVIDER_SITE_OTHER): Payer: BC Managed Care – PPO | Admitting: Women's Health

## 2016-12-04 VITALS — BP 128/86 | Ht 64.0 in | Wt 237.0 lb

## 2016-12-04 DIAGNOSIS — Z13220 Encounter for screening for lipoid disorders: Secondary | ICD-10-CM

## 2016-12-04 DIAGNOSIS — Z01419 Encounter for gynecological examination (general) (routine) without abnormal findings: Secondary | ICD-10-CM | POA: Diagnosis not present

## 2016-12-04 NOTE — Patient Instructions (Signed)
Carbohydrate Counting for Diabetes Mellitus, Adult Carbohydrate counting is a method for keeping track of how many carbohydrates you eat. Eating carbohydrates naturally increases the amount of sugar (glucose) in the blood. Counting how many carbohydrates you eat helps keep your blood glucose within normal limits, which helps you manage your diabetes (diabetes mellitus). It is important to know how many carbohydrates you can safely have in each meal. This is different for every person. A diet and nutrition specialist (registered dietitian) can help you make a meal plan and calculate how many carbohydrates you should have at each meal and snack. Carbohydrates are found in the following foods:  Grains, such as breads and cereals.  Dried beans and soy products.  Starchy vegetables, such as potatoes, peas, and corn.  Fruit and fruit juices.  Milk and yogurt.  Sweets and snack foods, such as cake, cookies, candy, chips, and soft drinks. How do I count carbohydrates? There are two ways to count carbohydrates in food. You can use either of the methods or a combination of both. Reading "Nutrition Facts" on packaged food  The "Nutrition Facts" list is included on the labels of almost all packaged foods and beverages in the [place] It includes:  The serving size.  Information about nutrients in each serving, including the grams (g) of carbohydrate per serving. To use the "Nutrition Facts":  Decide how many servings you will have.  Multiply the number of servings by the number of carbohydrates per serving.  The resulting number is the total amount of carbohydrates that you will be having. Learning standard serving sizes of other foods  When you eat foods containing carbohydrates that are not packaged or do not include "Nutrition Facts" on the label, you need to measure the servings in order to count the amount of carbohydrates:  Measure the foods that you will eat with a food scale or measuring  cup, if needed.  Decide how many standard-size servings you will eat.  Multiply the number of servings by 15. Most carbohydrate-rich foods have about 15 g of carbohydrates per serving.  For example, if you eat 8 oz (170 g) of strawberries, you will have eaten 2 servings and 30 g of carbohydrates (2 servings x 15 g = 30 g).  For foods that have more than one food mixed, such as soups and casseroles, you must count the carbohydrates in each food that is included. The following list contains standard serving sizes of common carbohydrate-rich foods. Each of these servings has about 15 g of carbohydrates:   hamburger bun or  English muffin.   oz (15 mL) syrup.   oz (14 g) jelly.  1 slice of bread.  1 six-inch tortilla.  3 oz (85 g) cooked rice or pasta.  4 oz (113 g) cooked dried beans.  4 oz (113 g) starchy vegetable, such as peas, corn, or potatoes.  4 oz (113 g) hot cereal.  4 oz (113 g) mashed potatoes or  of a large baked potato.  4 oz (113 g) canned or frozen fruit.  4 oz (120 mL) fruit juice.  4-6 crackers.  6 chicken nuggets.  6 oz (170 g) unsweetened dry cereal.  6 oz (170 g) plain fat-free yogurt or yogurt sweetened with artificial sweeteners.  8 oz (240 mL) milk.  8 oz (170 g) fresh fruit or one small piece of fruit.  24 oz (680 g) popped popcorn. Example of carbohydrate counting Sample meal   3 oz (85 g) chicken breast.  6  oz (170 g) brown rice.  4 oz (113 g) corn.  8 oz (240 mL) milk.  8 oz (170 g) strawberries with sugar-free whipped topping. Carbohydrate calculation  1. Identify the foods that contain carbohydrates:  Rice.  Corn.  Milk.  Strawberries. 2. Calculate how many servings you have of each food:  2 servings rice.  1 serving corn.  1 serving milk.  1 serving strawberries. 3. Multiply each number of servings by 15 g:  2 servings rice x 15 g = 30 g.  1 serving corn x 15 g = 15 g.  1 serving milk x 15 g = 15  g.  1 serving strawberries x 15 g = 15 g. 4. Add together all of the amounts to find the total grams of carbohydrates eaten:  30 g + 15 g + 15 g + 15 g = 75 g of carbohydrates total. This information is not intended to replace advice given to you by your health care provider. Make sure you discuss any questions you have with your health care provider. Document Released: 08/14/2005 Document Revised: 03/03/2016 Document Reviewed: 01/26/2016 Elsevier Interactive Patient Education  2017 [employer] Health Maintenance, Female Adopting a healthy lifestyle and getting preventive care can go a long way to promote health and wellness. Talk with your health care provider about what schedule of regular examinations is right for you. This is a good chance for you to check in with your provider about disease prevention and staying healthy. In between checkups, there are plenty of things you can do on your own. Experts have done a lot of research about which lifestyle changes and preventive measures are most likely to keep you healthy. Ask your health care provider for more information. Weight and diet Eat a healthy diet  Be sure to include plenty of vegetables, fruits, low-fat dairy products, and lean protein.  Do not eat a lot of foods high in solid fats, added sugars, or salt.  Get regular exercise. This is one of the most important things you can do for your health.  Most adults should exercise for at least 150 minutes each week. The exercise should increase your heart rate and make you sweat (moderate-intensity exercise).  Most adults should also do strengthening exercises at least twice a week. This is in addition to the moderate-intensity exercise. Maintain a healthy weight  Body mass index (BMI) is a measurement that can be used to identify possible weight problems. It estimates body fat based on height and weight. Your health care provider can help determine your BMI and help you achieve or  maintain a healthy weight.  For females 20 years of age and older:  A BMI below 18.5 is considered underweight.  A BMI of 18.5 to 24.9 is normal.  A BMI of 25 to 29.9 is considered overweight.  A BMI of 30 and above is considered obese. Watch levels of cholesterol and blood lipids  You should start having your blood tested for lipids and cholesterol at 43 years of age, then have this test every 5 years.  You may need to have your cholesterol levels checked more often if:  Your lipid or cholesterol levels are high.  You are older than 43 years of age.  You are at high risk for heart disease. Cancer screening Lung Cancer  Lung cancer screening is recommended for adults 55-80 years old who are at high risk for lung cancer because of a history of smoking.  A yearly low-dose CT   scan of the lungs is recommended for people who:  Currently smoke.  Have quit within the past 15 years.  Have at least a 30-pack-year history of smoking. A pack year is smoking an average of one pack of cigarettes a day for 1 year.  Yearly screening should continue until it has been 15 years since you quit.  Yearly screening should stop if you develop a health problem that would prevent you from having lung cancer treatment. Breast Cancer  Practice breast self-awareness. This means understanding how your breasts normally appear and feel.  It also means doing regular breast self-exams. Let your health care provider know about any changes, no matter how small.  If you are in your 20s or 30s, you should have a clinical breast exam (CBE) by a health care provider every 1-3 years as part of a regular health exam.  If you are 40 or older, have a CBE every year. Also consider having a breast X-ray (mammogram) every year.  If you have a family history of breast cancer, talk to your health care provider about genetic screening.  If you are at high risk for breast cancer, talk to your health care provider  about having an MRI and a mammogram every year.  Breast cancer gene (BRCA) assessment is recommended for women who have family members with BRCA-related cancers. BRCA-related cancers include:  Breast.  Ovarian.  Tubal.  Peritoneal cancers.  Results of the assessment will determine the need for genetic counseling and BRCA1 and BRCA2 testing. Cervical Cancer  Your health care provider may recommend that you be screened regularly for cancer of the pelvic organs (ovaries, uterus, and vagina). This screening involves a pelvic examination, including checking for microscopic changes to the surface of your cervix (Pap test). You may be encouraged to have this screening done every 3 years, beginning at age 21.  For women ages 30-65, health care providers may recommend pelvic exams and Pap testing every 3 years, or they may recommend the Pap and pelvic exam, combined with testing for human papilloma virus (HPV), every 5 years. Some types of HPV increase your risk of cervical cancer. Testing for HPV may also be done on women of any age with unclear Pap test results.  Other health care providers may not recommend any screening for nonpregnant women who are considered low risk for pelvic cancer and who do not have symptoms. Ask your health care provider if a screening pelvic exam is right for you.  If you have had past treatment for cervical cancer or a condition that could lead to cancer, you need Pap tests and screening for cancer for at least 20 years after your treatment. If Pap tests have been discontinued, your risk factors (such as having a new sexual partner) need to be reassessed to determine if screening should resume. Some women have medical problems that increase the chance of getting cervical cancer. In these cases, your health care provider may recommend more frequent screening and Pap tests. Colorectal Cancer  This type of cancer can be detected and often prevented.  Routine colorectal  cancer screening usually begins at 43 years of age and continues through 43 years of age.  Your health care provider may recommend screening at an earlier age if you have risk factors for colon cancer.  Your health care provider may also recommend using home test kits to check for hidden blood in the stool.  A small camera at the end of a tube can be used   to examine your colon directly (sigmoidoscopy or colonoscopy). This is done to check for the earliest forms of colorectal cancer.  Routine screening usually begins at age 69.  Direct examination of the colon should be repeated every 5-10 years through 43 years of age. However, you may need to be screened more often if early forms of precancerous polyps or small growths are found. Skin Cancer  Check your skin from head to toe regularly.  Tell your health care provider about any new moles or changes in moles, especially if there is a change in a mole's shape or color.  Also tell your health care provider if you have a mole that is larger than the size of a pencil eraser.  Always use sunscreen. Apply sunscreen liberally and repeatedly throughout the day.  Protect yourself by wearing long sleeves, pants, a wide-brimmed hat, and sunglasses whenever you are outside. Heart disease, diabetes, and high blood pressure  High blood pressure causes heart disease and increases the risk of stroke. High blood pressure is more likely to develop in:  People who have blood pressure in the high end of the normal range (130-139/85-89 mm Hg).  People who are overweight or obese.  People who are African American.  If you are 6-34 years of age, have your blood pressure checked every 3-5 years. If you are 54 years of age or older, have your blood pressure checked every year. You should have your blood pressure measured twice-once when you are at a hospital or clinic, and once when you are not at a hospital or clinic. Record the average of the two  measurements. To check your blood pressure when you are not at a hospital or clinic, you can use:  An automated blood pressure machine at a pharmacy.  A home blood pressure monitor.  If you are between 36 years and 36 years old, ask your health care provider if you should take aspirin to prevent strokes.  Have regular diabetes screenings. This involves taking a blood sample to check your fasting blood sugar level.  If you are at a normal weight and have a low risk for diabetes, have this test once every three years after 43 years of age.  If you are overweight and have a high risk for diabetes, consider being tested at a younger age or more often. Preventing infection Hepatitis B  If you have a higher risk for hepatitis B, you should be screened for this virus. You are considered at high risk for hepatitis B if:  You were born in a country where hepatitis B is common. Ask your health care provider which countries are considered high risk.  Your parents were born in a high-risk country, and you have not been immunized against hepatitis B (hepatitis B vaccine).  You have HIV or AIDS.  You use needles to inject street drugs.  You live with someone who has hepatitis B.  You have had sex with someone who has hepatitis B.  You get hemodialysis treatment.  You take certain medicines for conditions, including cancer, organ transplantation, and autoimmune conditions. Hepatitis C  Blood testing is recommended for:  Everyone born from [birth year] through 1967.  Anyone with known risk factors for hepatitis C. Sexually transmitted infections (STIs)  You should be screened for sexually transmitted infections (STIs) including gonorrhea and chlamydia if:  You are sexually active and are younger than 43 years of age.  You are older than 43 years of age and your health care provider  tells you that you are at risk for this type of infection.  Your sexual activity has changed since you were last  screened and you are at an increased risk for chlamydia or gonorrhea. Ask your health care provider if you are at risk.  If you do not have HIV, but are at risk, it may be recommended that you take a prescription medicine daily to prevent HIV infection. This is called pre-exposure prophylaxis (PrEP). You are considered at risk if:  You are sexually active and do not regularly use condoms or know the HIV status of your partner(s).  You take drugs by injection.  You are sexually active with a partner who has HIV. Talk with your health care provider about whether you are at high risk of being infected with HIV. If you choose to begin PrEP, you should first be tested for HIV. You should then be tested every 3 months for as long as you are taking PrEP. Pregnancy  If you are premenopausal and you may become pregnant, ask your health care provider about preconception counseling.  If you may become pregnant, take 400 to 800 micrograms (mcg) of folic acid every day.  If you want to prevent pregnancy, talk to your health care provider about birth control (contraception). Osteoporosis and menopause  Osteoporosis is a disease in which the bones lose minerals and strength with aging. This can result in serious bone fractures. Your risk for osteoporosis can be identified using a bone density scan.  If you are 30 years of age or older, or if you are at risk for osteoporosis and fractures, ask your health care provider if you should be screened.  Ask your health care provider whether you should take a calcium or vitamin D supplement to lower your risk for osteoporosis.  Menopause may have certain physical symptoms and risks.  Hormone replacement therapy may reduce some of these symptoms and risks. Talk to your health care provider about whether hormone replacement therapy is right for you. Follow these instructions at home:  Schedule regular health, dental, and eye exams.  Stay current with your  immunizations.  Do not use any tobacco products including cigarettes, chewing tobacco, or electronic cigarettes.  If you are pregnant, do not drink alcohol.  If you are breastfeeding, limit how much and how often you drink alcohol.  Limit alcohol intake to no more than 1 drink per day for nonpregnant women. One drink equals 12 ounces of beer, 5 ounces of wine, or 1 ounces of hard liquor.  Do not use street drugs.  Do not share needles.  Ask your health care provider for help if you need support or information about quitting drugs.  Tell your health care provider if you often feel depressed.  Tell your health care provider if you have ever been abused or do not feel safe at home. This information is not intended to replace advice given to you by your health care provider. Make sure you discuss any questions you have with your health care provider. Document Released: 02/27/2011 Document Revised: 01/20/2016 Document Reviewed: 05/18/2015 Elsevier Interactive Patient Education  2017 [employer].

## 2016-12-04 NOTE — Progress Notes (Signed)
Katelyn [name] Sep 09, 1975 [id number]    History:    Presents for annual exam.  12-16-13 TAH for fibroids on no HRT. Had a normal baseline mammogram has not had one recent. Normal Pap history. History of slightly elevated hemoglobin A1c and lipid panel. Normal CBC. Same partner. History of GDM. Maternal aunt breast cancer.  Past medical history, past surgical history, family history and social history were all reviewed and documented in the EPIC chart. Engaged planning on marriage 10/18/2017. 75-year-old Katelyn [name] died of stroke 16-Dec-2012, healthy prior. 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  ROS:  A ROS was performed and pertinent positives and negatives are included.  Exam:  Vitals:   12/04/16 1057  BP: 128/86  Weight: 237 lb (107.5 kg)  Height: 5\' 4"  (1.626 m)   Body mass index is 40.68 kg/m.   General appearance:  Normal Thyroid:  Symmetrical, normal in size, without palpable masses or nodularity. Respiratory  Auscultation:  Clear without wheezing or rhonchi Cardiovascular  Auscultation:  Regular rate, without rubs, murmurs or gallops  Edema/varicosities:  Not grossly evident Abdominal  Soft,nontender, without masses, guarding or rebound.  Liver/spleen:  No organomegaly noted  Hernia:  None appreciated  Skin  Inspection:  Grossly normal   Breasts: Examined lying and sitting.     Right: Without masses, retractions, discharge or axillary adenopathy.     Left: Without masses, retractions, discharge or axillary adenopathy. Gentitourinary   Inguinal/mons:  Normal without inguinal adenopathy  External genitalia:  Normal  BUS/Urethra/Skene's glands:  Normal  Vagina:  Normal  Cervix:  And uterus absent  Adnexa/parametria:     Rt: Without masses or tenderness.   Lt: Without masses or tenderness.  Anus and perineum: Normal  Digital rectal exam: Normal sphincter tone without palpated masses or tenderness  Assessment/Plan:  41 y.o. engaged BF  G1 P1- deceased  for annual exam.    2013-12-16 TAH for fibroids Obesity  Plan: SBE's, reviewed importance of annual screening mammogram, will call breast center. Increase exercise and decrease calories for weight loss encouraged. Calcium rich diet. CBC, lipid panel, CMP.    Huel Cote [facility], 12:59 PM 12/04/2016

## 2017-01-10 ENCOUNTER — Encounter: Payer: Self-pay | Admitting: Gynecology

## 2017-04-02 ENCOUNTER — Encounter: Payer: Self-pay | Admitting: Women's Health

## 2017-04-16 ENCOUNTER — Ambulatory Visit (INDEPENDENT_AMBULATORY_CARE_PROVIDER_SITE_OTHER): Payer: BC Managed Care – PPO | Admitting: Women's Health

## 2017-04-16 ENCOUNTER — Encounter: Payer: Self-pay | Admitting: Women's Health

## 2017-04-16 VITALS — BP 122/80 | Ht 64.0 in | Wt 237.0 lb

## 2017-04-16 DIAGNOSIS — Z538 Procedure and treatment not carried out for other reasons: Secondary | ICD-10-CM

## 2017-04-16 NOTE — Progress Notes (Signed)
Presented for pap, got an alert on her my chart saying she needed a Pap smear. History of all normal Paps, 2015 TAH for fibroids, reviewed does not need a Pap .

## 2017-05-22 ENCOUNTER — Ambulatory Visit (HOSPITAL_COMMUNITY)
Admission: RE | Admit: 2017-05-22 | Discharge: 2017-05-22 | Disposition: A | Discharge status: Home / Self Care | Payer: BC Managed Care – PPO | Source: Ambulatory Visit | Attending: Student | Admitting: Student

## 2017-05-22 ENCOUNTER — Other Ambulatory Visit: Payer: Self-pay | Admitting: Student

## 2017-05-22 DIAGNOSIS — H538 Other visual disturbances: Secondary | ICD-10-CM

## 2017-12-05 ENCOUNTER — Ambulatory Visit: Payer: BC Managed Care – PPO | Admitting: Women's Health

## 2017-12-05 ENCOUNTER — Encounter: Payer: Self-pay | Admitting: Women's Health

## 2017-12-05 VITALS — BP 126/80 | Ht 65.5 in | Wt 226.6 lb

## 2017-12-05 DIAGNOSIS — Z01419 Encounter for gynecological examination (general) (routine) without abnormal findings: Secondary | ICD-10-CM | POA: Diagnosis not present

## 2017-12-05 NOTE — Progress Notes (Signed)
Katelyn [name] 08/26/1976 [id number]    History:    Presents for annual exam.  2015 TAH for fibroids.  History of GDM.  Normal Pap history, has not had a screening mammogram.  January 4450  her 44-year-old daughter Katelyn [name] died of a stroke, healthy prior.  Past medical history, past surgical history, family history and social history were all reviewed and documented in the EPIC chart.  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  Parents healthy, maternal aunt with  uterine and ovarian cancer after menopause.  Planning marriage fall 2019.  ROS:  A ROS was performed and pertinent positives and negatives are included.  Exam:  Vitals:   12/05/17 0958  BP: 126/80  Weight: 226 lb 9.6 oz (102.8 kg)  Height: 5' 5.5" (1.664 m)   Body mass index is 37.13 kg/m.   General appearance:  Normal Thyroid:  Symmetrical, normal in size, without palpable masses or nodularity. Respiratory  Auscultation:  Clear without wheezing or rhonchi Cardiovascular  Auscultation:  Regular rate, without rubs, murmurs or gallops  Edema/varicosities:  Not grossly evident Abdominal  Soft,nontender, without masses, guarding or rebound.  Liver/spleen:  No organomegaly noted  Hernia:  None appreciated  Skin  Inspection:  Grossly normal   Breasts: Examined lying and sitting.     Right: Without masses, retractions, discharge or axillary adenopathy.     Left: Without masses, retractions, discharge or axillary adenopathy. Gentitourinary   Inguinal/mons:  Normal without inguinal adenopathy  External genitalia:  Normal  BUS/Urethra/Skene's glands:  Normal  Vagina:  Normal  Cervix: And uterus absent  Adnexa/parametria:     Rt: Without masses or tenderness.   Lt: Without masses or tenderness.  Anus and perineum: Normal  Digital rectal exam: Normal sphincter tone without palpated masses or tenderness  Assessment/Plan:  42 y.o. engaged BF G1P1/deceased for annual exam no complaints.    2015 TAH for  fibroids Diabetes-[facility] primary care  Obesity  Pain: Continue healthy lifestyle with increasing regular exercise, low-carb/calorie diet.  SBE's, has not had screening mammogram reviewed importance of annual screening, instructed to schedule.  Breast center information given.  Huel Cote [facility], 10:13 AM 12/05/2017

## 2017-12-05 NOTE — Patient Instructions (Addendum)
[facility] [phone]   Get it scheduled!!!!  Carbohydrate Counting for Diabetes Mellitus, Adult Carbohydrate counting is a method for keeping track of how many carbohydrates you eat. Eating carbohydrates naturally increases the amount of sugar (glucose) in the blood. Counting how many carbohydrates you eat helps keep your blood glucose within normal limits, which helps you manage your diabetes (diabetes mellitus). It is important to know how many carbohydrates you can safely have in each meal. This is different for every person. A diet and nutrition specialist (registered dietitian) can help you make a meal plan and calculate how many carbohydrates you should have at each meal and snack. Carbohydrates are found in the following foods:  Grains, such as breads and cereals.  Dried beans and soy products.  Starchy vegetables, such as potatoes, peas, and corn.  Fruit and fruit juices.  Milk and yogurt.  Sweets and snack foods, such as cake, cookies, candy, chips, and soft drinks.  How do I count carbohydrates? There are two ways to count carbohydrates in food. You can use either of the methods or a combination of both. Reading "Nutrition Facts" on packaged food The "Nutrition Facts" list is included on the labels of almost all packaged foods and beverages in the [place] It includes:  The serving size.  Information about nutrients in each serving, including the grams (g) of carbohydrate per serving.  To use the "Nutrition Facts":  Decide how many servings you will have.  Multiply the number of servings by the number of carbohydrates per serving.  The resulting number is the total amount of carbohydrates that you will be having.  Learning standard serving sizes of other foods When you eat foods containing carbohydrates that are not packaged or do not include "Nutrition Facts" on the label, you need to measure the servings in order to count the amount of carbohydrates:  Measure the  foods that you will eat with a food scale or measuring cup, if needed.  Decide how many standard-size servings you will eat.  Multiply the number of servings by 15. Most carbohydrate-rich foods have about 15 g of carbohydrates per serving. ? For example, if you eat 8 oz (170 g) of strawberries, you will have eaten 2 servings and 30 g of carbohydrates (2 servings x 15 g = 30 g).  For foods that have more than one food mixed, such as soups and casseroles, you must count the carbohydrates in each food that is included.  The following list contains standard serving sizes of common carbohydrate-rich foods. Each of these servings has about 15 g of carbohydrates:   hamburger bun or  English muffin.   oz (15 mL) syrup.   oz (14 g) jelly.  1 slice of bread.  1 six-inch tortilla.  3 oz (85 g) cooked rice or pasta.  4 oz (113 g) cooked dried beans.  4 oz (113 g) starchy vegetable, such as peas, corn, or potatoes.  4 oz (113 g) hot cereal.  4 oz (113 g) mashed potatoes or  of a large baked potato.  4 oz (113 g) canned or frozen fruit.  4 oz (120 mL) fruit juice.  4-6 crackers.  6 chicken nuggets.  6 oz (170 g) unsweetened dry cereal.  6 oz (170 g) plain fat-free yogurt or yogurt sweetened with artificial sweeteners.  8 oz (240 mL) milk.  8 oz (170 g) fresh fruit or one small piece of fruit.  24 oz (680 g) popped popcorn.  Example of carbohydrate  counting Sample meal  3 oz (85 g) chicken breast.  6 oz (170 g) brown rice.  4 oz (113 g) corn.  8 oz (240 mL) milk.  8 oz (170 g) strawberries with sugar-free whipped topping. Carbohydrate calculation 1. Identify the foods that contain carbohydrates: ? Rice. ? Corn. ? Milk. ? Strawberries. 2. Calculate how many servings you have of each food: ? 2 servings rice. ? 1 serving corn. ? 1 serving milk. ? 1 serving strawberries. 3. Multiply each number of servings by 15 g: ? 2 servings rice x 15 g = 30 g. ? 1  serving corn x 15 g = 15 g. ? 1 serving milk x 15 g = 15 g. ? 1 serving strawberries x 15 g = 15 g. 4. Add together all of the amounts to find the total grams of carbohydrates eaten: ? 30 g + 15 g + 15 g + 15 g = 75 g of carbohydrates total. This information is not intended to replace advice given to you by your health care provider. Make sure you discuss any questions you have with your health care provider. Document Released: 08/14/2005 Document Revised: 03/03/2016 Document Reviewed: 01/26/2016 Elsevier Interactive Patient Education  2018 [place] Maintenance, Female Adopting a healthy lifestyle and getting preventive care can go a long way to promote health and wellness. Talk with your health care provider about what schedule of regular examinations is right for you. This is a good chance for you to check in with your provider about disease prevention and staying healthy. In between checkups, there are plenty of things you can do on your own. Experts have done a lot of research about which lifestyle changes and preventive measures are most likely to keep you healthy. Ask your health care provider for more information. Weight and diet Eat a healthy diet  Be sure to include plenty of vegetables, fruits, low-fat dairy products, and lean protein.  Do not eat a lot of foods high in solid fats, added sugars, or salt.  Get regular exercise. This is one of the most important things you can do for your health. ? Most adults should exercise for at least 150 minutes each week. The exercise should increase your heart rate and make you sweat (moderate-intensity exercise). ? Most adults should also do strengthening exercises at least twice a week. This is in addition to the moderate-intensity exercise.  Maintain a healthy weight  Body mass index (BMI) is a measurement that can be used to identify possible weight problems. It estimates body fat based on height and weight. Your health  care provider can help determine your BMI and help you achieve or maintain a healthy weight.  For females 44 years of age and older: ? A BMI below 18.5 is considered underweight. ? A BMI of 18.5 to 24.9 is normal. ? A BMI of 25 to 29.9 is considered overweight. ? A BMI of 30 and above is considered obese.  Watch levels of cholesterol and blood lipids  You should start having your blood tested for lipids and cholesterol at 44 years of age, then have this test every 5 years.  You may need to have your cholesterol levels checked more often if: ? Your lipid or cholesterol levels are high. ? You are older than 44 years of age. ? You are at high risk for heart disease.  Cancer screening Lung Cancer  Lung cancer screening is recommended for adults 50-42 years old who are at  high risk for lung cancer because of a history of smoking.  A yearly low-dose CT scan of the lungs is recommended for people who: ? Currently smoke. ? Have quit within the past 15 years. ? Have at least a 30-pack-year history of smoking. A pack year is smoking an average of one pack of cigarettes a day for 1 year.  Yearly screening should continue until it has been 15 years since you quit.  Yearly screening should stop if you develop a health problem that would prevent you from having lung cancer treatment.  Breast Cancer  Practice breast self-awareness. This means understanding how your breasts normally appear and feel.  It also means doing regular breast self-exams. Let your health care provider know about any changes, no matter how small.  If you are in your 20s or 30s, you should have a clinical breast exam (CBE) by a health care provider every 1-3 years as part of a regular health exam.  If you are 25 or older, have a CBE every year. Also consider having a breast X-ray (mammogram) every year.  If you have a family history of breast cancer, talk to your health care provider about genetic screening.  If you  are at high risk for breast cancer, talk to your health care provider about having an MRI and a mammogram every year.  Breast cancer gene (BRCA) assessment is recommended for women who have family members with BRCA-related cancers. BRCA-related cancers include: ? Breast. ? Ovarian. ? Tubal. ? Peritoneal cancers.  Results of the assessment will determine the need for genetic counseling and BRCA1 and BRCA2 testing.  Cervical Cancer Your health care provider may recommend that you be screened regularly for cancer of the pelvic organs (ovaries, uterus, and vagina). This screening involves a pelvic examination, including checking for microscopic changes to the surface of your cervix (Pap test). You may be encouraged to have this screening done every 3 years, beginning at age 66.  For women ages 22-65, health care providers may recommend pelvic exams and Pap testing every 3 years, or they may recommend the Pap and pelvic exam, combined with testing for human papilloma virus (HPV), every 5 years. Some types of HPV increase your risk of cervical cancer. Testing for HPV may also be done on women of any age with unclear Pap test results.  Other health care providers may not recommend any screening for nonpregnant women who are considered low risk for pelvic cancer and who do not have symptoms. Ask your health care provider if a screening pelvic exam is right for you.  If you have had past treatment for cervical cancer or a condition that could lead to cancer, you need Pap tests and screening for cancer for at least 20 years after your treatment. If Pap tests have been discontinued, your risk factors (such as having a new sexual partner) need to be reassessed to determine if screening should resume. Some women have medical problems that increase the chance of getting cervical cancer. In these cases, your health care provider may recommend more frequent screening and Pap tests.  Colorectal Cancer  This type  of cancer can be detected and often prevented.  Routine colorectal cancer screening usually begins at 44 years of age and continues through 44 years of age.  Your health care provider may recommend screening at an earlier age if you have risk factors for colon cancer.  Your health care provider may also recommend using home test kits to check for  hidden blood in the stool.  A small camera at the end of a tube can be used to examine your colon directly (sigmoidoscopy or colonoscopy). This is done to check for the earliest forms of colorectal cancer.  Routine screening usually begins at age 35.  Direct examination of the colon should be repeated every 5-10 years through 44 years of age. However, you may need to be screened more often if early forms of precancerous polyps or small growths are found.  Skin Cancer  Check your skin from head to toe regularly.  Tell your health care provider about any new moles or changes in moles, especially if there is a change in a mole's shape or color.  Also tell your health care provider if you have a mole that is larger than the size of a pencil eraser.  Always use sunscreen. Apply sunscreen liberally and repeatedly throughout the day.  Protect yourself by wearing long sleeves, pants, a wide-brimmed hat, and sunglasses whenever you are outside.  Heart disease, diabetes, and high blood pressure  High blood pressure causes heart disease and increases the risk of stroke. High blood pressure is more likely to develop in: ? People who have blood pressure in the high end of the normal range (130-139/85-89 mm Hg). ? People who are overweight or obese. ? People who are African American.  If you are 69-32 years of age, have your blood pressure checked every 3-5 years. If you are 44 years of age or older, have your blood pressure checked every year. You should have your blood pressure measured twice-once when you are at a hospital or clinic, and once when you  are not at a hospital or clinic. Record the average of the two measurements. To check your blood pressure when you are not at a hospital or clinic, you can use: ? An automated blood pressure machine at a pharmacy. ? A home blood pressure monitor.  If you are between 100 years and 59 years old, ask your health care provider if you should take aspirin to prevent strokes.  Have regular diabetes screenings. This involves taking a blood sample to check your fasting blood sugar level. ? If you are at a normal weight and have a low risk for diabetes, have this test once every three years after 44 years of age. ? If you are overweight and have a high risk for diabetes, consider being tested at a younger age or more often. Preventing infection Hepatitis B  If you have a higher risk for hepatitis B, you should be screened for this virus. You are considered at high risk for hepatitis B if: ? You were born in a country where hepatitis B is common. Ask your health care provider which countries are considered high risk. ? Your parents were born in a high-risk country, and you have not been immunized against hepatitis B (hepatitis B vaccine). ? You have HIV or AIDS. ? You use needles to inject street drugs. ? You live with someone who has hepatitis B. ? You have had sex with someone who has hepatitis B. ? You get hemodialysis treatment. ? You take certain medicines for conditions, including cancer, organ transplantation, and autoimmune conditions.  Hepatitis C  Blood testing is recommended for: ? Everyone born from [birth year] through 1967. ? Anyone with known risk factors for hepatitis C.  Sexually transmitted infections (STIs)  You should be screened for sexually transmitted infections (STIs) including gonorrhea and chlamydia if: ? You are sexually active  and are younger than 44 years of age. ? You are older than 44 years of age and your health care provider tells you that you are at risk for this type of  infection. ? Your sexual activity has changed since you were last screened and you are at an increased risk for chlamydia or gonorrhea. Ask your health care provider if you are at risk.  If you do not have HIV, but are at risk, it may be recommended that you take a prescription medicine daily to prevent HIV infection. This is called pre-exposure prophylaxis (PrEP). You are considered at risk if: ? You are sexually active and do not regularly use condoms or know the HIV status of your partner(s). ? You take drugs by injection. ? You are sexually active with a partner who has HIV.  Talk with your health care provider about whether you are at high risk of being infected with HIV. If you choose to begin PrEP, you should first be tested for HIV. You should then be tested every 3 months for as long as you are taking PrEP. Pregnancy  If you are premenopausal and you may become pregnant, ask your health care provider about preconception counseling.  If you may become pregnant, take 400 to 800 micrograms (mcg) of folic acid every day.  If you want to prevent pregnancy, talk to your health care provider about birth control (contraception). Osteoporosis and menopause  Osteoporosis is a disease in which the bones lose minerals and strength with aging. This can result in serious bone fractures. Your risk for osteoporosis can be identified using a bone density scan.  If you are 87 years of age or older, or if you are at risk for osteoporosis and fractures, ask your health care provider if you should be screened.  Ask your health care provider whether you should take a calcium or vitamin D supplement to lower your risk for osteoporosis.  Menopause may have certain physical symptoms and risks.  Hormone replacement therapy may reduce some of these symptoms and risks. Talk to your health care provider about whether hormone replacement therapy is right for you. Follow these instructions at home:  Schedule  regular health, dental, and eye exams.  Stay current with your immunizations.  Do not use any tobacco products including cigarettes, chewing tobacco, or electronic cigarettes.  If you are pregnant, do not drink alcohol.  If you are breastfeeding, limit how much and how often you drink alcohol.  Limit alcohol intake to no more than 1 drink per day for nonpregnant women. One drink equals 12 ounces of beer, 5 ounces of wine, or 1 ounces of hard liquor.  Do not use street drugs.  Do not share needles.  Ask your health care provider for help if you need support or information about quitting drugs.  Tell your health care provider if you often feel depressed.  Tell your health care provider if you have ever been abused or do not feel safe at home. This information is not intended to replace advice given to you by your health care provider. Make sure you discuss any questions you have with your health care provider. Document Released: 02/27/2011 Document Revised: 01/20/2016 Document Reviewed: 05/18/2015 Elsevier Interactive Patient Education  [employer].

## 2018-03-06 ENCOUNTER — Other Ambulatory Visit: Payer: Self-pay | Admitting: Family Medicine

## 2018-03-06 DIAGNOSIS — R310 Gross hematuria: Secondary | ICD-10-CM

## 2018-03-07 ENCOUNTER — Other Ambulatory Visit: Payer: Self-pay | Admitting: Women's Health

## 2018-03-07 DIAGNOSIS — Z1231 Encounter for screening mammogram for malignant neoplasm of breast: Secondary | ICD-10-CM

## 2018-03-13 ENCOUNTER — Inpatient Hospital Stay
Admission: RE | Admit: 2018-03-13 | Discharge: 2018-03-13 | Disposition: A | Discharge status: Home / Self Care | Payer: BC Managed Care – PPO | Source: Ambulatory Visit | Attending: Family Medicine | Admitting: Family Medicine

## 2018-03-22 ENCOUNTER — Ambulatory Visit
Admission: RE | Admit: 2018-03-22 | Discharge: 2018-03-22 | Disposition: A | Discharge status: Home / Self Care | Payer: BC Managed Care – PPO | Source: Ambulatory Visit | Attending: Family Medicine | Admitting: Family Medicine

## 2018-03-22 DIAGNOSIS — R310 Gross hematuria: Secondary | ICD-10-CM

## 2018-04-01 ENCOUNTER — Ambulatory Visit
Admission: RE | Admit: 2018-04-01 | Discharge: 2018-04-01 | Disposition: A | Discharge status: Home / Self Care | Payer: BC Managed Care – PPO | Source: Ambulatory Visit | Attending: Women's Health | Admitting: Women's Health

## 2018-04-01 DIAGNOSIS — Z1231 Encounter for screening mammogram for malignant neoplasm of breast: Secondary | ICD-10-CM

## 2018-04-02 ENCOUNTER — Encounter (INDEPENDENT_AMBULATORY_CARE_PROVIDER_SITE_OTHER): Payer: Self-pay

## 2018-11-20 IMAGING — MG DIGITAL SCREENING BILATERAL MAMMOGRAM WITH TOMO AND CAD
8 series · 8 of 24 positions shown · non-contrast
Comparison: None.

CLINICAL DATA: Screening.

EXAM:
DIGITAL SCREENING BILATERAL MAMMOGRAM WITH TOMO AND CAD

[L CC synth-2D]
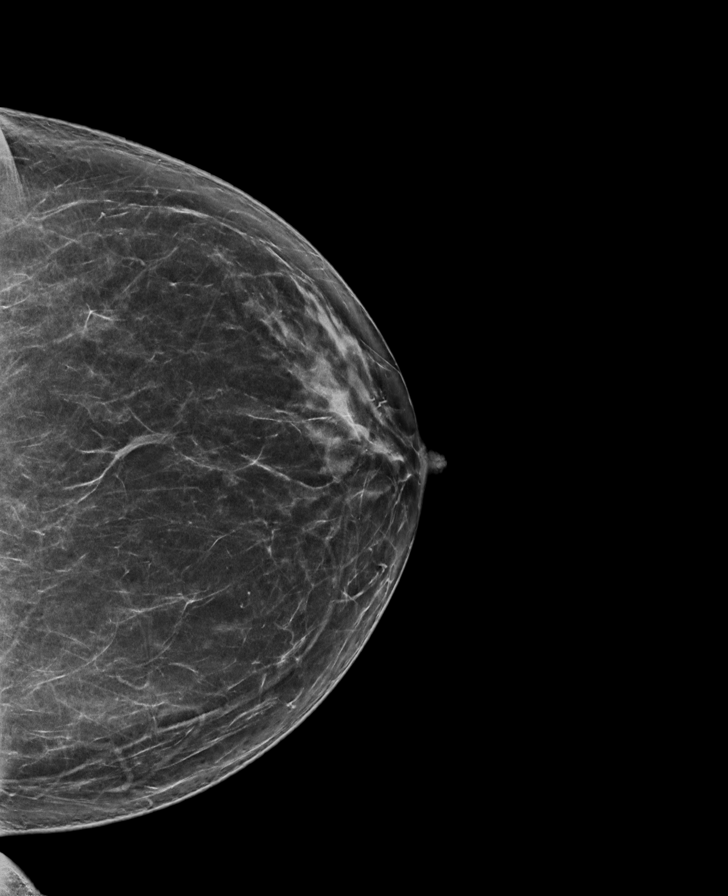

[R CC synth-2D]
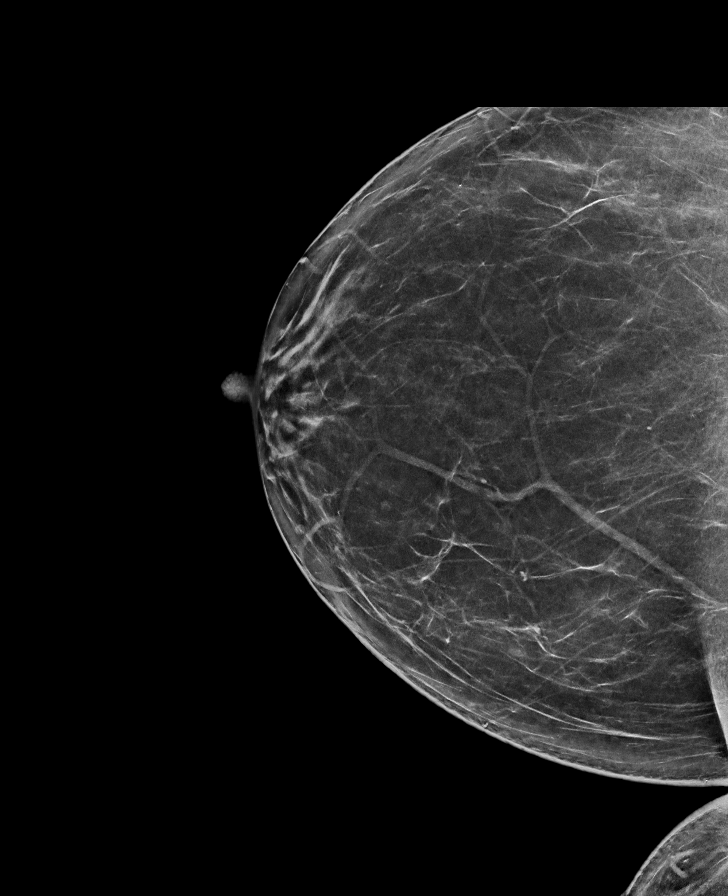

[L MLO synth-2D]
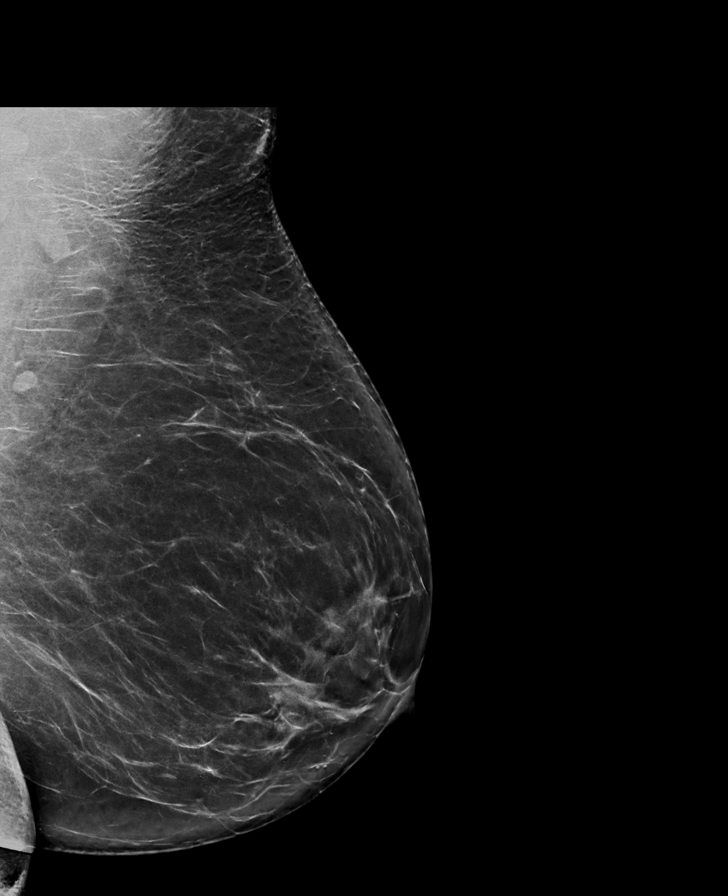

[R MLO synth-2D]
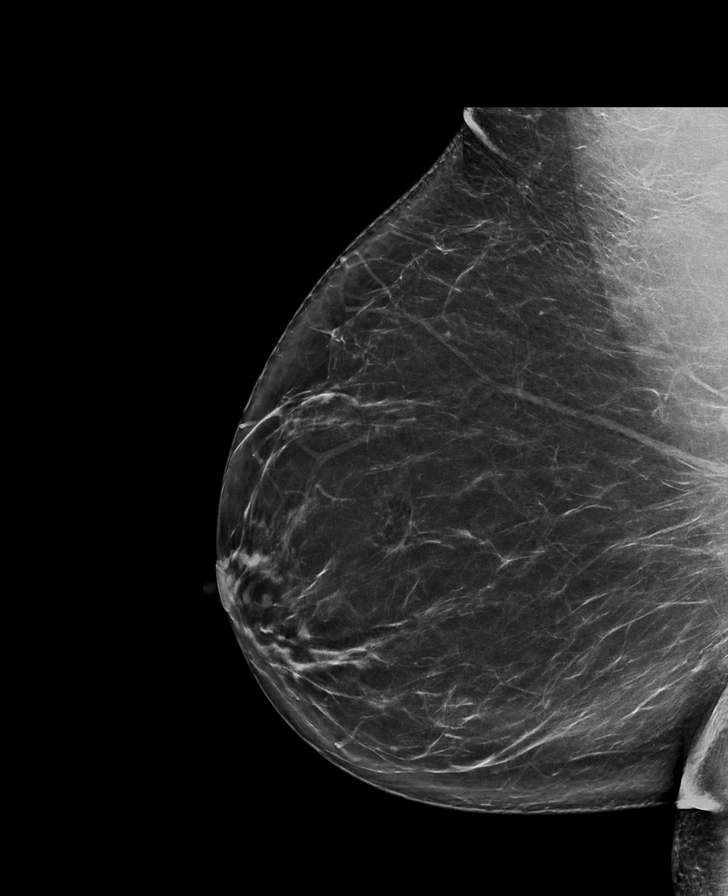

[L MLO tomo · tomo slice 46/91.0]
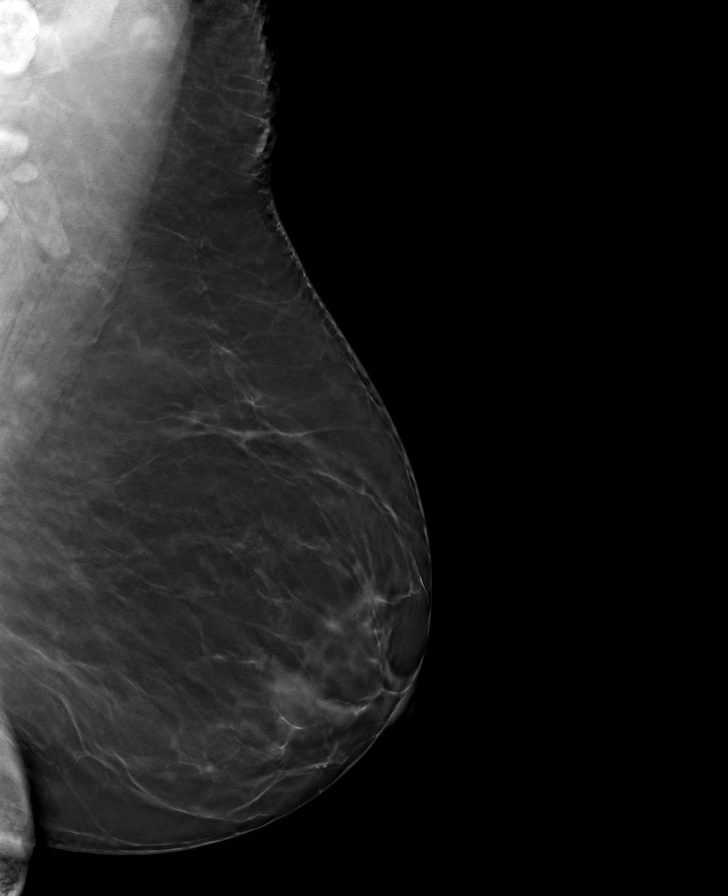

[R CC tomo · tomo slice 41/81.0]
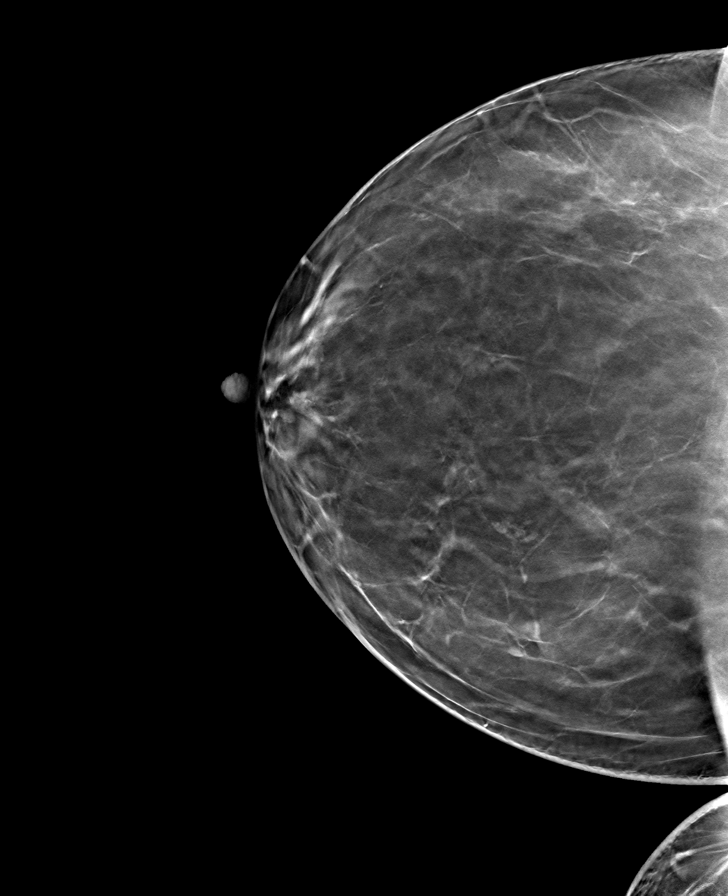

[L CC tomo · tomo slice 41/80.0]
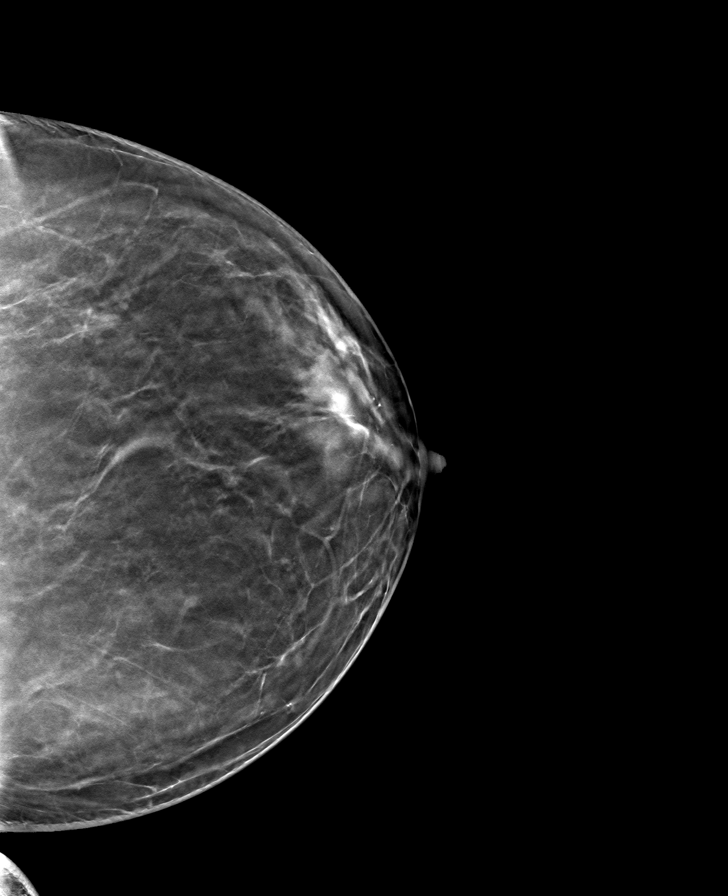

[R MLO tomo · tomo slice 45/90.0]
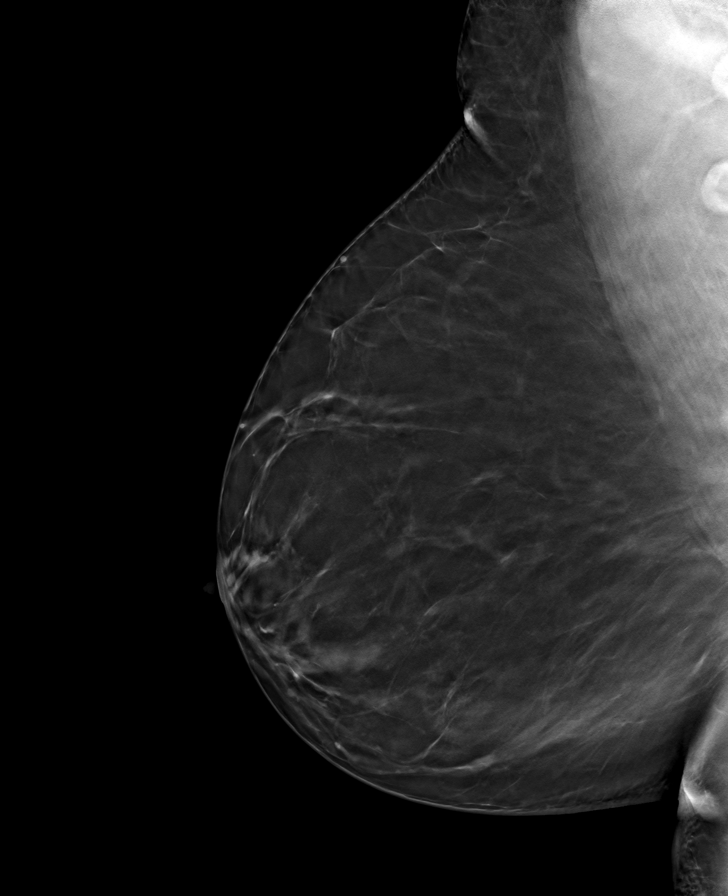

[8 of 24 positions shown; findings below may reference images not displayed]

ACR Breast Density Category b: There are scattered areas of
fibroglandular density.
FINDINGS: There are no findings suspicious for malignancy. Images were
processed with CAD.
IMPRESSION: No mammographic evidence of malignancy. A result letter of this
screening mammogram will be mailed directly to the patient.

RECOMMENDATION:
Screening mammogram in one year. (Code:[id number])

BI-RADS CATEGORY  1: Negative.

## 2018-12-11 ENCOUNTER — Encounter: Payer: BC Managed Care – PPO | Admitting: Women's Health

## 2019-04-21 ENCOUNTER — Other Ambulatory Visit: Payer: Self-pay

## 2019-04-22 ENCOUNTER — Encounter: Payer: BC Managed Care – PPO | Admitting: Women's Health

## 2019-04-22 DIAGNOSIS — Z0289 Encounter for other administrative examinations: Secondary | ICD-10-CM

## 2019-05-16 ENCOUNTER — Other Ambulatory Visit: Payer: Self-pay

## 2019-05-19 ENCOUNTER — Encounter: Payer: BC Managed Care – PPO | Admitting: Women's Health

## 2019-05-19 DIAGNOSIS — Z0289 Encounter for other administrative examinations: Secondary | ICD-10-CM

## 2020-04-13 ENCOUNTER — Other Ambulatory Visit: Payer: Self-pay

## 2020-04-13 ENCOUNTER — Other Ambulatory Visit: Payer: BC Managed Care – PPO

## 2020-04-13 DIAGNOSIS — Z20822 Contact with and (suspected) exposure to covid-19: Secondary | ICD-10-CM

## 2020-04-14 LAB — SARS-COV-2, NAA 2 DAY TAT

## 2020-04-14 LAB — NOVEL CORONAVIRUS, NAA: SARS-CoV-2, NAA: NOT DETECTED

## 2020-09-10 ENCOUNTER — Ambulatory Visit: Payer: BC Managed Care – PPO | Admitting: Nurse Practitioner

## 2020-09-10 ENCOUNTER — Other Ambulatory Visit: Payer: Self-pay

## 2020-09-10 ENCOUNTER — Encounter: Payer: Self-pay | Admitting: Nurse Practitioner

## 2020-09-10 VITALS — BP 118/78 | HR 70 | Temp 99.3°F | Resp 16 | Wt 235.0 lb

## 2020-09-10 DIAGNOSIS — R102 Pelvic and perineal pain: Secondary | ICD-10-CM | POA: Diagnosis not present

## 2020-09-10 DIAGNOSIS — B373 Candidiasis of vulva and vagina: Secondary | ICD-10-CM | POA: Diagnosis not present

## 2020-09-10 DIAGNOSIS — R109 Unspecified abdominal pain: Secondary | ICD-10-CM

## 2020-09-10 DIAGNOSIS — B3731 Acute candidiasis of vulva and vagina: Secondary | ICD-10-CM

## 2020-09-10 LAB — WET PREP FOR TRICH, YEAST, CLUE

## 2020-09-10 LAB — URINALYSIS, COMPLETE W/RFL CULTURE

## 2020-09-10 LAB — CULTURE INDICATED

## 2020-09-10 MED ORDER — FLUCONAZOLE 150 MG PO TABS: 150.0000 mg | ORAL_TABLET | Freq: Once | ORAL | 0 refills | Status: AC

## 2020-09-10 NOTE — Patient Instructions (Signed)
Pelvic Pain, Female Pelvic pain is pain in your lower belly (abdomen), below your belly button and between your hips. The pain may start suddenly (be acute), keep coming back (be recurring), or last a long time (become chronic). Pelvic pain that lasts longer than 6 months is called chronic pelvic pain. There are many causes of pelvic pain. Sometimes the cause of pelvic pain is not known. Follow these instructions at home:  Take over-the-counter and prescription medicines only as told by your doctor.  Rest as told by your doctor.  Do not have sex if it hurts.  Keep a journal of your pelvic pain. Write down: ? When the pain started. ? Where the pain is located. ? What seems to make the pain better or worse, such as food or your period (menstrual cycle). ? Any symptoms you have along with the pain.  Keep all follow-up visits as told by your doctor. This is important.   Contact a doctor if:  Medicine does not help your pain.  Your pain comes back.  You have new symptoms.  You have unusual discharge or bleeding from your vagina.  You have a fever or chills.  You are having trouble pooping (constipation).  You have blood in your pee (urine) or poop (stool).  Your pee smells bad.  You feel weak or light-headed. Get help right away if:  You have sudden pain that is very bad.  Your pain keeps getting worse.  You have very bad pain and also have any of these symptoms: ? A fever. ? Feeling sick to your stomach (nausea). ? Throwing up (vomiting). ? Being very sweaty.  You pass out (lose consciousness). Summary  Pelvic pain is pain in your lower belly (abdomen), below your belly button and between your hips.  There are many possible causes of pelvic pain.  Keep a journal of your pelvic pain. This information is not intended to replace advice given to you by your health care provider. Make sure you discuss any questions you have with your health care provider. Document  Revised: 01/30/2018 Document Reviewed: 01/30/2018 Elsevier Patient Education  [place].

## 2020-09-10 NOTE — Progress Notes (Signed)
GYNECOLOGY  VISIT  CC:   Pelvic pain  HPI: 45 y.o. G1P1 Married Katelyn [name] female here for lower abdominal pain, urge to urinate. S/p hyst for fibroids, still has ovaries  Last night, woke up with a pain, pressure on her bladder with an intense urge to urinate. Could not move because it hurt, made it to the bathroom and urinated, urinated a long time. Urine looked cloudy. Had never experienced that sensation in the past. Pain felt stabbing.  Now feels fine. Denies dysuria, urgency no frequency. Denies pain, nausea, vomiting, change in appetite.  Denies vaginal irritation, itching or burning but noticed "dead skin" appearing discharge from vagina    GYNECOLOGIC HISTORY: Patient's last menstrual period was 12/16/2013. Contraception: hysterectomy Menopausal hormone therapy: none  Patient Active Problem List   Diagnosis Date Noted  . Family history of sudden cardiac death in daughter 11-24-15  . Anemia 06/30/2013    Past Medical History:  Diagnosis Date  . Anemia   . Depression   . Fibroid   . Hx gestational diabetes    hx 2007  . Medical history non-contributory     Past Surgical History:  Procedure Laterality Date  . ABDOMINAL HYSTERECTOMY N/A 01/07/2014   Procedure: HYSTERECTOMY ABDOMINAL WITH BILATERAL SALPINGECTOMY;  Surgeon: Terrance Mass, MD;  Location: [facility];  Service: Gynecology;  Laterality: N/A;  Dr. Phineas Real to assist.  2nd choice will be 1:00pm on 01/07/14. Thanks!  . CESAREAN SECTION     x 1  . PELVIC LAPAROSCOPY  1993   cyst removed  . WISDOM TOOTH EXTRACTION      MEDS:   No current outpatient medications on file prior to visit.   No current facility-administered medications on file prior to visit.    ALLERGIES: Latex  Family History  Problem Relation Age of Onset  . Cancer Maternal Aunt        ovarian and uterine  . Breast cancer Maternal Aunt        unsure of age  . Diabetes Maternal Grandmother   . Hypertension Maternal  Grandmother   . High blood pressure Maternal Grandmother      Review of Systems  Constitutional: Negative.   HENT: Negative.   Eyes: Negative.   Respiratory: Negative.   Cardiovascular: Negative.   Gastrointestinal: Negative.   Endocrine: Negative.   Genitourinary: Negative.        Urge to urinate, lower abdominal pain  Musculoskeletal: Negative.   Skin: Negative.   Allergic/Immunologic: Negative.   Neurological: Negative.   Hematological: Negative.   Psychiatric/Behavioral: Negative.     PHYSICAL EXAMINATION:    BP 118/78   Pulse 70   Temp 99.3 F (37.4 C) (Oral)   Resp 16   Wt 235 lb (106.6 kg)   LMP 12/16/2013   BMI 38.51 kg/m     General appearance: alert, cooperative, no acute distress  Abdomen: soft, mild tenderness along lower pelvis, not well localized, neg CVAT Lymph:  no inguinal LAD noted  Pelvic: External genitalia:  no lesions              Urethra:  normal appearing urethra with no masses, tenderness or lesions              Bartholins and Skenes: normal                 Vagina:small amount white dischrage               Cervix: absent  Bimanual Exam:  Uterus:  uterus absent              Adnexa: tender, guarding, unable to palpate ovaries               Chaperone, [name], CMA, was present for exam.  Assessment/Plan: Pelvic pain of unknown origin, now mostly subsided except during palpation  Differential diagnosis include possible ovarian cyst, ruptured ovarian cyst, over distended bladder Urinalysis trace WBC, most likely contaminated specimen (culture sent for confirmation)  Offered wait and watch approach vs scheduling pelvic US, pt desires to  Schedule [place].   Abdominal pain, unspecified abdominal location - Plan: Urinalysis,Complete w/RFL Culture, WET PREP FOR TRICH, YEAST, CLUE  Pelvic pain - Plan: US Pelvis Complete  Candida vaginitis - Plan: fluconazole (DIFLUCAN) 150 MG tablet    30 minutes of total time was spent for this  patient encounter, including preparation, face-to-face counseling with the patient and coordination of care, and documentation of the encounter.

## 2020-09-12 ENCOUNTER — Encounter: Payer: Self-pay | Admitting: Nurse Practitioner

## 2020-09-12 LAB — URINALYSIS, COMPLETE W/RFL CULTURE
Bilirubin Urine: NEGATIVE
Glucose, UA: NEGATIVE
Hyaline Cast: NONE SEEN /LPF
Ketones, ur: NEGATIVE
Nitrites, Initial: NEGATIVE
Protein, ur: NEGATIVE
RBC / HPF: NONE SEEN /HPF (ref 0–2)
Specific Gravity, Urine: 1.02 (ref 1.001–1.03)
pH: 5.5 (ref 5.0–8.0)

## 2020-09-12 LAB — URINE CULTURE
MICRO NUMBER:: 11419918
SPECIMEN QUALITY:: ADEQUATE

## 2020-09-21 ENCOUNTER — Telehealth: Payer: Self-pay

## 2020-09-21 NOTE — Telephone Encounter (Signed)
Call to patient. Per DPR, OK to leave message on voicemail.  Left voicemail requesting a return call to [facility] to review benefits and schedule recommended Pelvic ultrasound with ANY PROVIDER. 

## 2020-09-23 NOTE — Telephone Encounter (Signed)
Call to patient. Per DPR, OK to leave message on voicemail.  Left voicemail requesting a return call to [facility] to review benefits and schedule recommended Pelvic ultrasound with ANY PROVIDER. 

## 2020-10-01 ENCOUNTER — Ambulatory Visit: Payer: Self-pay | Admitting: Obstetrics and Gynecology

## 2020-10-01 NOTE — Progress Notes (Deleted)
45 y.o. G1P1 Married {Race/ethnicity:17218} female here for annual exam.    PCP:  Horald Pollen, MD   Patient's last menstrual period was 12/16/2013.           Sexually active: {yes no:314532}  The current method of family planning is status post hysterectomy.    Exercising: {yes no:314532}  {types:19826} Smoker:  {YES NO:22349}  Health Maintenance: Pap:  ***06-30-13 Neg, 05-15-12 Neg:Neg HR HPV History of abnormal Pap:  {YES NO:22349} MMG:  ***04-01-18 Neg/BiRads1 Colonoscopy:  *** BMD:   ***  Result  *** TDaP:  *** Gardasil:   no HIV:08-14-13 NR Hep C:08-14-13 Neg Screening Labs:  Hb today: ***, Urine today: ***   reports that she has quit smoking. Her smoking use included cigarettes. She has never used smokeless tobacco. She reports current alcohol use. She reports that she does not use drugs.  Past Medical History:  Diagnosis Date  . Anemia   . Depression   . Diabetes mellitus without complication ([place])   . Fibroid   . Hx gestational diabetes    hx 2007  . Medical history non-contributory     Past Surgical History:  Procedure Laterality Date  . ABDOMINAL HYSTERECTOMY N/A 01/07/2014   Procedure: HYSTERECTOMY ABDOMINAL WITH BILATERAL SALPINGECTOMY;  Surgeon: Terrance Mass, MD;  Location: [facility];  Service: Gynecology;  Laterality: N/A;  Dr. Phineas Real to assist.  2nd choice will be 1:00pm on 01/07/14. Thanks!  . CESAREAN SECTION     x 1  . PELVIC LAPAROSCOPY  1993   cyst removed  . WISDOM TOOTH EXTRACTION      No current outpatient medications on file.   No current facility-administered medications for this visit.    Family History  Problem Relation Age of Onset  . Cancer Maternal Aunt        ovarian and uterine  . Breast cancer Maternal Aunt        unsure of age  . Diabetes Maternal Grandmother   . Hypertension Maternal Grandmother   . High blood pressure Maternal Grandmother     Review of Systems  Exam:   LMP 12/16/2013     General appearance:  alert, cooperative and appears stated age Head: normocephalic, without obvious abnormality, atraumatic Neck: no adenopathy, supple, symmetrical, trachea midline and thyroid normal to inspection and palpation Lungs: clear to auscultation bilaterally Breasts: normal appearance, no masses or tenderness, No nipple retraction or dimpling, No nipple discharge or bleeding, No axillary adenopathy Heart: regular rate and rhythm Abdomen: soft, non-tender; no masses, no organomegaly Extremities: extremities normal, atraumatic, no cyanosis or edema Skin: skin color, texture, turgor normal. No rashes or lesions Lymph nodes: cervical, supraclavicular, and axillary nodes normal. Neurologic: grossly normal  Pelvic: External genitalia:  no lesions              No abnormal inguinal nodes palpated.              Urethra:  normal appearing urethra with no masses, tenderness or lesions              Bartholins and Skenes: normal                 Vagina: normal appearing vagina with normal color and discharge, no lesions              Cervix: no lesions              Pap taken: {yes no:314532} Bimanual Exam:  Uterus:  normal size, contour, position, consistency,  mobility, non-tender              Adnexa: no mass, fullness, tenderness              Rectal exam: {yes no:314532}.  Confirms.              Anus:  normal sphincter tone, no lesions  Chaperone was present for exam.  Assessment:   Well woman visit with normal exam.   Plan: Mammogram screening discussed. Self breast awareness reviewed. Pap and HR HPV as above. Guidelines for Calcium, Vitamin D, regular exercise program including cardiovascular and weight bearing exercise.   Follow up annually and prn.   Additional counseling given.  {yes [id number]. _______ minutes face to face time of which over 50% was spent in counseling.    After visit summary provided.

## 2020-10-21 ENCOUNTER — Other Ambulatory Visit: Payer: Self-pay

## 2020-10-21 ENCOUNTER — Other Ambulatory Visit: Payer: BC Managed Care – PPO | Admitting: Obstetrics and Gynecology

## 2020-10-21 ENCOUNTER — Ambulatory Visit: Payer: BC Managed Care – PPO

## 2020-10-21 ENCOUNTER — Other Ambulatory Visit: Payer: BC Managed Care – PPO

## 2020-10-21 DIAGNOSIS — R102 Pelvic and perineal pain: Secondary | ICD-10-CM | POA: Diagnosis not present

## 2020-11-07 ENCOUNTER — Telehealth: Payer: Self-pay | Admitting: Obstetrics and Gynecology

## 2020-11-07 NOTE — Telephone Encounter (Signed)
Please contact patient and let her know that I reviewed her pelvic ultrasound done in my absence on 10/21/20.   Her ovaries were normal.  No masses were seen.  No fluid was seen collecting in the pelvis.  Her uterus is surgically absent.   All in all, her images and report are reassuring and normal.  I hope she is not having recurrent pain.

## 2020-11-08 NOTE — Telephone Encounter (Signed)
Patient voicemail is full

## 2021-09-15 ENCOUNTER — Ambulatory Visit: Payer: BC Managed Care – PPO | Admitting: Nurse Practitioner

## 2021-11-07 NOTE — Progress Notes (Signed)
° °  Katelyn [name] 28-Nov-1975 [id number]   History:  46 y.o. G1P1000 presents for annual exam. S/P 2015 TAH fibroids. Normal pap history. Grandmother just passed from colon cancer, she is traveling to [place] for service.   Gynecologic History Patient's last menstrual period was 12/16/2013.   Contraception/Family planning: status post hysterectomy Sexually active: Yes  Health Maintenance Last Pap: 06/30/2013. Results were: Normal Last mammogram: 04/01/2018. Results were: Normal Last colonoscopy: Never  Last Dexa: Not indicated  Past medical history, past surgical history, family history and social history were all reviewed and documented in the EPIC chart. 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 Maternal aunt with history of breast, uterine, and ovarian cancer.   ROS:  A ROS was performed and pertinent positives and negatives are included.  Exam:  Vitals:   11/08/21 0848  BP: 122/82  Weight: 230 lb (104.3 kg)  Height: 5\' 4"  (1.626 m)   Body mass index is 39.48 kg/m.  General appearance:  Normal Thyroid:  Symmetrical, normal in size, without palpable masses or nodularity. Respiratory  Auscultation:  Clear without wheezing or rhonchi Cardiovascular  Auscultation:  Regular rate, without rubs, murmurs or gallops  Edema/varicosities:  Not grossly evident Abdominal  Soft,nontender, without masses, guarding or rebound.  Liver/spleen:  No organomegaly noted  Hernia:  None appreciated  Skin  Inspection:  Grossly normal Breasts: Examined lying and sitting.   Right: Without masses, retractions, nipple discharge or axillary adenopathy.   Left: Without masses, retractions, nipple discharge or axillary adenopathy. Genitourinary   Inguinal/mons:  Normal without inguinal adenopathy  External genitalia:  Normal appearing vulva with no masses, tenderness, or lesions  BUS/Urethra/Skene's glands:  Normal  Vagina:  Normal appearing with normal color and discharge, no lesions  Cervix:   Absent  Uterus:  Absent  Adnexa/parametria:     Rt: Normal in size, without masses or tenderness.   Lt: Normal in size, without masses or tenderness.  Anus and perineum: Normal  Digital rectal exam: Normal sphincter tone without palpated masses or tenderness  Patient informed chaperone available to be present for breast and pelvic exam. Patient has requested no chaperone to be present. Patient has been advised what will be completed during breast and pelvic exam.   Assessment/Plan:  46 y.o. G1P1000 for annual exam.   Well female exam with routine gynecological exam - Education provided on SBEs, importance of preventative screenings, current guidelines, high calcium diet, regular exercise, and multivitamin daily.  Labs with PCP.   Screening for cervical cancer - Normal Pap history. No longer screening per guidelines.   Screening for breast cancer - Normal mammogram history.  Overdue. Discussed current guidelines and importance of preventative screenings. Information provided on [facility]. Normal breast exam today.  Screening for colon cancer - Has not had screening colonoscopy. MGM just passed from colon cancer. Information provided on [facility].   Return in 1 year for annual.     Olivia Mackie DNP, 8:58 AM 11/08/2021

## 2021-11-08 ENCOUNTER — Encounter: Payer: Self-pay | Admitting: Nurse Practitioner

## 2021-11-08 ENCOUNTER — Ambulatory Visit (INDEPENDENT_AMBULATORY_CARE_PROVIDER_SITE_OTHER): Payer: BC Managed Care – PPO | Admitting: Nurse Practitioner

## 2021-11-08 ENCOUNTER — Other Ambulatory Visit: Payer: Self-pay

## 2021-11-08 VITALS — BP 122/82 | Ht 64.0 in | Wt 230.0 lb

## 2021-11-08 DIAGNOSIS — Z01419 Encounter for gynecological examination (general) (routine) without abnormal findings: Secondary | ICD-10-CM

## 2021-11-08 NOTE — Patient Instructions (Signed)
Schedule Colonoscopy! ?[facility] ?[phone] ?[address] [place], [place] [address] ? ?

## 2022-02-10 ENCOUNTER — Encounter: Payer: Self-pay | Admitting: Internal Medicine

## 2022-03-01 ENCOUNTER — Ambulatory Visit (AMBULATORY_SURGERY_CENTER): Payer: Self-pay | Admitting: *Deleted

## 2022-03-01 ENCOUNTER — Encounter: Payer: Self-pay | Admitting: Internal Medicine

## 2022-03-01 VITALS — Ht 65.0 in | Wt 234.0 lb

## 2022-03-01 DIAGNOSIS — Z1211 Encounter for screening for malignant neoplasm of colon: Secondary | ICD-10-CM

## 2022-03-01 MED ORDER — NA SULFATE-K SULFATE-MG SULF 17.5-3.13-1.6 GM/177ML PO SOLN: 2.0000 | Freq: Once | ORAL | 0 refills | Status: AC

## 2022-03-01 NOTE — Progress Notes (Signed)
Pt does have what she describes as "an intolerance" to egg products, she is a vegan. OK with soy products. No issues known to pt with past sedation with any surgeries or procedures Patient denies ever being told they had issues or difficulty with intubation  No FH of Malignant Hyperthermia Pt is not on diet pills Pt is not on  home 02  Pt is not on blood thinners   No A fib or A flutter   Discussed with pt there will be an out-of-pocket cost for prep and that varies from $0 to 70 +  dollars - pt verbalized understanding  Pt instructed to use Singlecare.com or GoodRx for a price reduction on prep   PV completed in person. Pt verified name, DOB.  Procedure explained to pt. Prep instructions reviewed, questions answered. Pt encouraged to call with questions or issues.  If pt has My chart, procedure instructions sent via My Chart

## 2022-03-22 ENCOUNTER — Ambulatory Visit (AMBULATORY_SURGERY_CENTER): Payer: BC Managed Care – PPO | Admitting: Internal Medicine

## 2022-03-22 ENCOUNTER — Encounter: Payer: Self-pay | Admitting: Internal Medicine

## 2022-03-22 VITALS — BP 106/88 | HR 65 | Temp 97.3°F | Resp 15 | Ht 64.0 in | Wt 234.0 lb

## 2022-03-22 DIAGNOSIS — K5140 Inflammatory polyps of colon without complications: Secondary | ICD-10-CM

## 2022-03-22 DIAGNOSIS — D120 Benign neoplasm of cecum: Secondary | ICD-10-CM

## 2022-03-22 DIAGNOSIS — Z1211 Encounter for screening for malignant neoplasm of colon: Secondary | ICD-10-CM

## 2022-03-22 DIAGNOSIS — D123 Benign neoplasm of transverse colon: Secondary | ICD-10-CM

## 2022-03-22 MED ORDER — SODIUM CHLORIDE 0.9 % IV SOLN: 500.0000 mL | Freq: Once | INTRAVENOUS | Status: DC

## 2022-03-22 NOTE — Progress Notes (Signed)
Sedate, gd SR, tolerated procedure well, VSS, report to RN 

## 2022-03-22 NOTE — Patient Instructions (Signed)

## 2022-03-22 NOTE — Progress Notes (Signed)
Pt's states no medical or surgical changes since previsit or office visit. 

## 2022-03-22 NOTE — Progress Notes (Signed)
HISTORY OF PRESENT ILLNESS:  Katelyn [name] is a 46 y.o. female who presents for index screening colonoscopy.  No complaints  REVIEW OF SYSTEMS:  All non-GI ROS negative. Past Medical History:  Diagnosis Date   Anemia    Depression    Fibroid    Hx gestational diabetes    hx 2007   Medical history non-contributory     Past Surgical History:  Procedure Laterality Date   ABDOMINAL HYSTERECTOMY N/A 01/07/2014   Procedure: HYSTERECTOMY ABDOMINAL WITH BILATERAL SALPINGECTOMY;  Surgeon: Terrance Mass, MD;  Location: [facility];  Service: Gynecology;  Laterality: N/A;  Dr. Phineas Real to assist.  2nd choice will be 1:00pm on 01/07/14. Thanks!   CESAREAN SECTION     x 1   PELVIC LAPAROSCOPY  1993   cyst removed   WISDOM TOOTH EXTRACTION      Social History Katelyn [name]  reports that she has quit smoking. Her smoking use included cigarettes. She has never used smokeless tobacco. She reports current alcohol use. She reports that she does not use drugs.  family history includes Breast cancer in her maternal aunt; Cancer in her maternal aunt and maternal grandmother; Colon cancer in her maternal aunt and maternal grandmother; Diabetes in her maternal grandmother; High blood pressure in her maternal grandmother; Hypertension in her maternal grandmother.  Allergies  Allergen Reactions   Latex Other (See Comments)    Burning with discharge with condom use       PHYSICAL EXAMINATION: Vital signs: BP 112/62   Pulse 64   Temp (!) 97.3 F (36.3 C)   Ht '5\' 4"'$  (1.626 m)   Wt 234 lb (106.1 kg)   LMP 12/16/2013   SpO2 98%   BMI 40.17 kg/m  General: Well-developed, well-nourished, no acute distress HEENT: Sclerae are anicteric, conjunctiva pink. Oral mucosa intact Lungs: Clear Heart: Regular Abdomen: soft, nontender, nondistended, no obvious ascites, no peritoneal signs, normal bowel sounds. No organomegaly. Extremities: No edema Psychiatric: alert and oriented x3. Cooperative       ASSESSMENT:   Colon cancer screening  PLAN:   Screening colonoscopy

## 2022-03-22 NOTE — Progress Notes (Signed)
Called to room to assist during endoscopic procedure.  Patient ID and intended procedure confirmed with present staff. Received instructions for my participation in the procedure from the performing physician.  

## 2022-03-22 NOTE — Op Note (Signed)
[facility] Patient Name: Katelyn [name] Procedure Date: 03/22/2022 11:05 AM MRN: [id number] Endoscopist: Docia Chuck. Henrene Pastor , MD Age: 46 Referring MD:  Date of Birth: 1976-01-30 Gender: Female Account #: [id number] Procedure:                Colonoscopy with cold snare polypectomy x 2 Indications:              Screening for colorectal malignant neoplasm Medicines:                Monitored Anesthesia Care Procedure:                Pre-Anesthesia Assessment:                           - Prior to the procedure, a History and Physical                            was performed, and patient medications and                            allergies were reviewed. The patient's tolerance of                            previous anesthesia was also reviewed. The risks                            and benefits of the procedure and the sedation                            options and risks were discussed with the patient.                            All questions were answered, and informed consent                            was obtained. Prior Anticoagulants: The patient has                            taken no previous anticoagulant or antiplatelet                            agents. ASA Grade Assessment: II - A patient with                            mild systemic disease. After reviewing the risks                            and benefits, the patient was deemed in                            satisfactory condition to undergo the procedure.                           After obtaining informed consent, the colonoscope  was passed under direct vision. Throughout the                            procedure, the patient's blood pressure, pulse, and                            oxygen saturations were monitored continuously. The                            CF [id number] #[id number] was introduced through the anus                            and advanced to the the cecum, identified by                             appendiceal orifice and ileocecal valve. The                            ileocecal valve, appendiceal orifice, and rectum                            were photographed. The quality of the bowel                            preparation was excellent. The colonoscopy was                            performed without difficulty. The patient tolerated                            the procedure well. The bowel preparation used was                            SUPREP via split dose instruction. Scope In: 11:20:41 AM Scope Out: 11:34:30 AM Scope Withdrawal Time: 0 hours 9 minutes 27 seconds  Total Procedure Duration: 0 hours 13 minutes 49 seconds  Findings:                 Two polyps were found in the transverse colon and                            ileocecal valve. The polyps were 4 to 5 mm in size.                            These polyps were removed with a cold snare.                            Resection and retrieval were complete.                           The exam was otherwise without abnormality on                            direct and retroflexion  views. Complications:            No immediate complications. Estimated blood loss:                            None. Estimated Blood Loss:     Estimated blood loss: none. Impression:               - Two 4 to 5 mm polyps in the transverse colon and                            at the ileocecal valve, removed with a cold snare.                            Resected and retrieved.                           - The examination was otherwise normal on direct                            and retroflexion views. Recommendation:           - Repeat colonoscopy in 7-10 years for surveillance.                           - Patient has a contact number available for                            emergencies. The signs and symptoms of potential                            delayed complications were discussed with the                            patient. Return to  normal activities tomorrow.                            Written discharge instructions were provided to the                            patient.                           - Resume previous diet.                           - Continue present medications.                           - Await pathology results. Docia Chuck. Henrene Pastor, MD 03/22/2022 11:41:14 AM This report has been signed electronically.

## 2022-03-23 ENCOUNTER — Telehealth: Payer: Self-pay | Admitting: *Deleted

## 2022-03-23 NOTE — Telephone Encounter (Signed)
Attempt for follow up phone call. No answer at number given.  Left message on voicemail.   

## 2022-03-24 ENCOUNTER — Encounter: Payer: Self-pay | Admitting: Internal Medicine

## 2022-11-07 ENCOUNTER — Encounter: Payer: Self-pay | Admitting: Nurse Practitioner

## 2022-11-14 ENCOUNTER — Encounter: Payer: Self-pay | Admitting: Nurse Practitioner

## 2022-11-14 ENCOUNTER — Ambulatory Visit (INDEPENDENT_AMBULATORY_CARE_PROVIDER_SITE_OTHER): Payer: BC Managed Care – PPO | Admitting: Nurse Practitioner

## 2022-11-14 VITALS — BP 112/72 | HR 82 | Ht 64.5 in | Wt 237.0 lb

## 2022-11-14 DIAGNOSIS — Z01419 Encounter for gynecological examination (general) (routine) without abnormal findings: Secondary | ICD-10-CM

## 2022-11-14 NOTE — Progress Notes (Signed)
   ZYIONNA BRACCIO 09/19/1975 [id number]   History:  47 y.o. G1P1000 presents for annual exam. No GYN complaints. S/P 2015 TAH fibroids. Normal pap history.   Gynecologic History Patient's last menstrual period was 12/16/2013.   Contraception/Family planning: status post hysterectomy Sexually active: Yes  Health Maintenance Last Pap: 06/30/2013. Results were: Normal Last mammogram: 04/01/2018. Results were: Normal Last colonoscopy: 03/22/2022. Results were: Benign polyps Last Dexa: Not indicated  Past medical history, past surgical history, family history and social history were all reviewed and documented in the EPIC chart. 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 MGM deceased from colon cancer.   ROS:  A ROS was performed and pertinent positives and negatives are included.  Exam:  Vitals:   11/14/22 0854  BP: 112/72  Pulse: 82  SpO2: 97%  Weight: 237 lb (107.5 kg)  Height: 5' 4.5" (1.638 m)    Body mass index is 40.05 kg/m.  General appearance:  Normal Thyroid:  Symmetrical, normal in size, without palpable masses or nodularity. Respiratory  Auscultation:  Clear without wheezing or rhonchi Cardiovascular  Auscultation:  Regular rate, without rubs, murmurs or gallops  Edema/varicosities:  Not grossly evident Abdominal  Soft,nontender, without masses, guarding or rebound.  Liver/spleen:  No organomegaly noted  Hernia:  None appreciated  Skin  Inspection:  Grossly normal Breasts: Examined lying and sitting.   Right: Without masses, retractions, nipple discharge or axillary adenopathy.   Left: Without masses, retractions, nipple discharge or axillary adenopathy. Genitourinary   Inguinal/mons:  Normal without inguinal adenopathy  External genitalia:  Normal appearing vulva with no masses, tenderness, or lesions  BUS/Urethra/Skene's glands:  Normal  Vagina:  Normal appearing with normal color and  discharge, no lesions  Cervix:  Absent  Uterus:  Absent  Adnexa/parametria:     Rt: Normal in size, without masses or tenderness.   Lt: Normal in size, without masses or tenderness.  Anus and perineum: Normal  Digital rectal exam: Deferred  Patient informed chaperone available to be present for breast and pelvic exam. Patient has requested no chaperone to be present. Patient has been advised what will be completed during breast and pelvic exam.   Assessment/Plan:  47 y.o. G1P1000 for annual exam.   Well female exam with routine gynecological exam - Education provided on SBEs, importance of preventative screenings, current guidelines, high calcium diet, regular exercise, and multivitamin daily.  Labs with PCP.   Screening for cervical cancer - Normal Pap history. No longer screening per guidelines.   Screening for breast cancer - Normal mammogram history.  Overdue. Discussed current guidelines and importance of preventative screenings. Information provided on [facility]. Normal breast exam today.  Screening for colon cancer - Colonoscopy 02/2022. Will repeat at GI's recommended interval.   Return in 1 year for annual.     Tamela Gammon DNP, 9:18 AM 11/14/2022

## 2023-12-26 ENCOUNTER — Ambulatory Visit (INDEPENDENT_AMBULATORY_CARE_PROVIDER_SITE_OTHER): Payer: Self-pay | Admitting: Nurse Practitioner

## 2023-12-26 ENCOUNTER — Encounter: Payer: Self-pay | Admitting: Nurse Practitioner

## 2023-12-26 VITALS — BP 122/82 | HR 88 | Ht 64.75 in | Wt 228.0 lb

## 2023-12-26 DIAGNOSIS — N951 Menopausal and female climacteric states: Secondary | ICD-10-CM

## 2023-12-26 DIAGNOSIS — Z01419 Encounter for gynecological examination (general) (routine) without abnormal findings: Secondary | ICD-10-CM

## 2023-12-26 DIAGNOSIS — Z1331 Encounter for screening for depression: Secondary | ICD-10-CM | POA: Diagnosis not present

## 2023-12-26 NOTE — Progress Notes (Signed)
 Katelyn [name] Feb 21, 1976 [id number]   History:  48 y.o. G1P1000 presents for annual exam. Complains of hot flashes, sleep disturbance, vaginal dryness, hair thinning, fatigue, brain fog and low libido. Takes Ashwaganda, Vit D3, mag citrate & Glycinate. S/P 2015 TAH fibroids. Normal pap history.   Gynecologic History Patient's last menstrual period was 12/16/2013.   Contraception/Family planning: status post hysterectomy Sexually active: Yes  Health Maintenance Last Pap: 06/30/2013. Results were: Normal Last mammogram: 2023 per patient. Results were: Normal Last colonoscopy: 03/22/2022. Results were: Benign polyps Last Dexa: Not indicated     12/26/2023    3:53 PM  Depression screen PHQ 2/9  Decreased Interest 0  Down, Depressed, Hopeless 0  PHQ - 2 Score 0     Past medical history, past surgical history, family history and social history were all reviewed and documented in the EPIC chart. 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 MGM deceased from colon cancer.   ROS:  A ROS was performed and pertinent positives and negatives are included.  Exam:  Vitals:   12/26/23 1547  BP: 122/82  Pulse: 88  SpO2: 96%  Weight: 228 lb (103.4 kg)  Height: 5' 4.75" (1.645 m)     Body mass index is 38.23 kg/m.  General appearance:  Normal Thyroid:  Symmetrical, normal in size, without palpable masses or nodularity. Respiratory  Auscultation:  Clear without wheezing or rhonchi Cardiovascular  Auscultation:  Regular rate, without rubs, murmurs or gallops  Edema/varicosities:  Not grossly evident Abdominal  Soft,nontender, without masses, guarding or rebound.  Liver/spleen:  No organomegaly noted  Hernia:  None appreciated  Skin  Inspection:  Grossly normal Breasts: Examined lying and sitting.   Right: Without masses, retractions, nipple discharge or axillary adenopathy.   Left: Without masses, retractions,  nipple discharge or axillary adenopathy. Pelvic: External genitalia:  no lesions              Urethra:  normal appearing urethra with no masses, tenderness or lesions              Bartholins and Skenes: normal                 Vagina: normal appearing vagina with normal color and discharge, no lesions              Cervix: absent Bimanual Exam:  Uterus: absent              Adnexa: no mass, fullness, tenderness              Rectovaginal: Deferred              Anus:  normal, no lesions  Patient informed chaperone available to be present for breast and pelvic exam. Patient has requested no chaperone to be present. Patient has been advised what will be completed during breast and pelvic exam.   Assessment/Plan:  48 y.o. G1P1000 for annual exam.   Well female exam with routine gynecological exam - Education provided on SBEs, importance of preventative screenings, current guidelines, high calcium diet, regular exercise, and multivitamin daily.  Labs with PCP.   Menopausal symptoms - discussed symptoms and management options. Considering HRT. Recommend mag L-threonate for brain fog.   Screening for cervical cancer - Normal Pap history. No longer screening per guidelines.   Screening for breast cancer - Normal mammogram history.  Overdue. Discussed current guidelines and importance of preventative screenings. Normal breast exam today.  Screening for colon cancer - Colonoscopy 02/2022. Will repeat at GI's recommended interval.   Return in about 1 year (around 12/25/2024) for Annual.    Andee Bamberger DNP, 4:17 PM 12/26/2023

## 2024-01-09 ENCOUNTER — Encounter: Payer: Self-pay | Admitting: Nurse Practitioner

## 2024-01-10 ENCOUNTER — Other Ambulatory Visit: Payer: Self-pay | Admitting: Nurse Practitioner

## 2024-01-10 DIAGNOSIS — Z79890 Hormone replacement therapy (postmenopausal): Secondary | ICD-10-CM

## 2024-01-10 MED ORDER — PROGESTERONE MICRONIZED 100 MG PO CAPS: 100.0000 mg | ORAL_CAPSULE | Freq: Every evening | ORAL | 1 refills | Status: DC

## 2024-01-10 MED ORDER — ESTRADIOL 0.025 MG/24HR TD PTWK: 0.0250 mg | MEDICATED_PATCH | TRANSDERMAL | 1 refills | Status: DC

## 2024-02-01 ENCOUNTER — Other Ambulatory Visit: Payer: Self-pay | Admitting: Nurse Practitioner

## 2024-02-01 DIAGNOSIS — Z79890 Hormone replacement therapy (postmenopausal): Secondary | ICD-10-CM

## 2024-02-01 NOTE — Telephone Encounter (Signed)
 Med refill request: progesterone  100 mg Pharmacy is requesting a 90 day supply. Last AEX: 12/26/23 Next AEX: 02/11/24 Last MMG (if hormonal med) 2023 per patient normal Refill written 01/10/24 for #30 with 1 refill.   Please approve or deny as appropriate.

## 2024-02-11 ENCOUNTER — Ambulatory Visit: Admitting: Nurse Practitioner

## 2024-02-11 ENCOUNTER — Encounter: Payer: Self-pay | Admitting: Nurse Practitioner

## 2024-02-11 ENCOUNTER — Other Ambulatory Visit: Payer: Self-pay | Admitting: Nurse Practitioner

## 2024-02-11 DIAGNOSIS — Z79890 Hormone replacement therapy (postmenopausal): Secondary | ICD-10-CM

## 2024-02-11 MED ORDER — PROGESTERONE MICRONIZED 100 MG PO CAPS: 100.0000 mg | ORAL_CAPSULE | Freq: Every day | ORAL | 1 refills | Status: DC

## 2024-02-11 MED ORDER — ESTRADIOL 0.025 MG/24HR TD PTWK: 0.0250 mg | MEDICATED_PATCH | TRANSDERMAL | 1 refills | Status: DC

## 2024-02-11 NOTE — Progress Notes (Deleted)
   Acute Office Visit  Subjective:    Patient ID: Katelyn [name], female    DOB: 09-Sep-1975, 48 y.o.   MRN: [id number]   HPI 48 y.o. presents today for 4-week follow up for HRT.   Patient's last menstrual period was 12/16/2013.    Review of Systems     Objective:    Physical Exam  LMP 12/16/2013  Wt Readings from Last 3 Encounters:  12/26/23 228 lb (103.4 kg)  11/14/22 237 lb (107.5 kg)  03/22/22 234 lb (106.1 kg)        Arvell Birchwood, CMA present as chaperone.   Assessment & Plan:   Problem List Items Addressed This Visit   None   No follow-ups on file.    Andee Bamberger DNP, 12:02 PM 02/11/2024

## 2024-02-11 NOTE — Telephone Encounter (Signed)
 Patient also left message on triage line.   Please advise.

## 2024-02-11 NOTE — Telephone Encounter (Signed)
 Per review of EPIC, OV for 6/16 has been cancelled.   Routing to [employer]

## 2024-05-27 ENCOUNTER — Institutional Professional Consult (permissible substitution) (HOSPITAL_BASED_OUTPATIENT_CLINIC_OR_DEPARTMENT_OTHER): Admitting: Internal Medicine

## 2024-07-16 ENCOUNTER — Telehealth (HOSPITAL_BASED_OUTPATIENT_CLINIC_OR_DEPARTMENT_OTHER): Payer: Self-pay | Admitting: *Deleted

## 2024-07-16 ENCOUNTER — Ambulatory Visit (HOSPITAL_BASED_OUTPATIENT_CLINIC_OR_DEPARTMENT_OTHER): Admitting: Internal Medicine

## 2024-07-16 ENCOUNTER — Encounter (HOSPITAL_BASED_OUTPATIENT_CLINIC_OR_DEPARTMENT_OTHER): Payer: Self-pay | Admitting: Internal Medicine

## 2024-07-16 VITALS — BP 126/80 | HR 80 | Ht 65.0 in | Wt 229.0 lb

## 2024-07-16 DIAGNOSIS — R5383 Other fatigue: Secondary | ICD-10-CM | POA: Diagnosis not present

## 2024-07-16 DIAGNOSIS — R0683 Snoring: Secondary | ICD-10-CM

## 2024-07-16 DIAGNOSIS — E669 Obesity, unspecified: Secondary | ICD-10-CM | POA: Diagnosis not present

## 2024-07-16 DIAGNOSIS — E785 Hyperlipidemia, unspecified: Secondary | ICD-10-CM

## 2024-07-16 DIAGNOSIS — E7849 Other hyperlipidemia: Secondary | ICD-10-CM | POA: Diagnosis not present

## 2024-07-16 NOTE — Patient Instructions (Signed)
 Medication Instructions:   Your physician recommends that you continue on your current medications as directed. Please refer to the Current Medication list given to you today.  *If you need a refill on your cardiac medications before your next appointment, please call your pharmacy*  Lab Work: Your physician recommends that you return for lab work in: ANYTIME THIS WEEK OR NEXT WEEK   Lp(a)  If you have labs (blood work) drawn today and your tests are completely normal, you will receive your results only by: MyChart Message (if you have MyChart) OR A paper copy in the mail If you have any lab test that is abnormal or we need to change your treatment, we will call you to review the results.   Testing/Procedures:  Genetic test for E78.01 ordered ([facility]) Cheek swab completed in office Specimen and necessary paperwork mailed. ID: [id number]         Follow-Up:  AS NEEDED WITH DR. HILTY--WILL BE DETERMINED BASED ON RESULTS

## 2024-07-16 NOTE — Telephone Encounter (Signed)
 Genetic test for E78.01 ordered ([facility]) Cheek swab completed in office Specimen and necessary paperwork mailed. ID: [id number]

## 2024-07-17 DIAGNOSIS — E7849 Other hyperlipidemia: Secondary | ICD-10-CM | POA: Insufficient documentation

## 2024-07-17 DIAGNOSIS — R0683 Snoring: Secondary | ICD-10-CM | POA: Insufficient documentation

## 2024-07-17 NOTE — Progress Notes (Signed)
 LIPID CLINIC CONSULT NOTE  Chief Complaint:  Manage dyslipidemia  Primary Care Physician: Katelyn Darice CROME, MD  Primary Cardiologist:  None  HPI:  Katelyn [name] is a 48 y.o. female who is being seen today for the evaluation of dyslipidemia at the request of Katelyn, Darice CROME, MD. this is a pleasant 48 year old female kindly referred for evaluation management of dyslipidemia.  She recently was found to have a marked elevation in LDL cholesterol.  She is reportedly under a lot of stress as a recent family member passed away and then her mother ended up having a heart attack.  She had multivessel bypass surgery in [place] .  There is reportedly strong family history of heart disease.  She had previously tried statins but was intolerant to rosuvastatin and atorvastatin.  She has been able to take ezetimibe and is trying diet and lifestyle modification in addition to citrus berberine.  This has however become quite expensive.  Other heart risk factors include type 2 diabetes with A1c 6.3%.  Cholesterol in October 2025 showed total 290, HDL 52, triglycerides 123 and LDL 211, suggestive of a familial hyperlipidemia.  PMHx:  Past Medical History:  Diagnosis Date   Anemia    Depression    Fibroid    Hx gestational diabetes    hx 2007   Medical history non-contributory     Past Surgical History:  Procedure Laterality Date   ABDOMINAL HYSTERECTOMY N/A 01/07/2014   Procedure: HYSTERECTOMY ABDOMINAL WITH BILATERAL SALPINGECTOMY;  Surgeon: Katelyn VEAR Guan, MD;  Location: [facility];  Service: Gynecology;  Laterality: N/A;  Dr. Rockney to assist.  2nd choice will be 1:00pm on 01/07/14. Thanks!   CESAREAN SECTION     x 1   PELVIC LAPAROSCOPY  1993   cyst removed   WISDOM TOOTH EXTRACTION      FAMHx:  Family History  Problem Relation Age of Onset   Colon cancer Maternal Aunt    Cancer Maternal Aunt        ovarian and uterine   Breast cancer Maternal Aunt        unsure of age   Colon  cancer Maternal Grandmother    Cancer Maternal Grandmother        Colon cancer   Diabetes Maternal Grandmother    Hypertension Maternal Grandmother    High blood pressure Maternal Grandmother    Colon polyps Neg Hx    Esophageal cancer Neg Hx    Stomach cancer Neg Hx    Rectal cancer Neg Hx     SOCHx:   reports that she has quit smoking. Her smoking use included cigarettes. She has been exposed to tobacco smoke. She has never used smokeless tobacco. She reports that she does not currently use alcohol. She reports that she does not use drugs.  ALLERGIES:  Allergies  Allergen Reactions   Latex Other (See Comments)    Burning with discharge with condom use    ROS: Pertinent items noted in HPI and remainder of comprehensive ROS otherwise negative.  HOME MEDS: Current Outpatient Medications on File Prior to Visit  Medication Sig Dispense Refill   Cholecalciferol (VITAMIN D-3 PO) Take 1 capsule by mouth 2 (two) times a week.     CO ENZYME Q-10 PO Take 1 capsule by mouth 2 (two) times a week.     estradiol  (CLIMARA ) 0.025 mg/24hr patch Place 1 patch (0.025 mg total) onto the skin once a week. 12 patch 1   Glutamine 500 MG CAPS  Take 1 capsule by mouth 2 (two) times a week.     MAGNESIUM GLYCINATE PO Take 1 capsule by mouth at bedtime.     MAGNESIUM PO Take 1 capsule by mouth 2 (two) times a week.     progesterone  (PROMETRIUM ) 100 MG capsule Take 1 capsule (100 mg total) by mouth at bedtime. 90 capsule 1   ASHWAGANDHA PO Take by mouth. (Patient not taking: Reported on 07/16/2024)     CHROMIUM PO Take by mouth. (Patient not taking: Reported on 07/16/2024)     No current facility-administered medications on file prior to visit.    LABS/IMAGING: No results found for this or any previous visit (from the past 48 hours). No results found.  LIPID PANEL:    Component Value Date/Time   CHOL 222 (H) 12/01/2015 1253   TRIG 129 12/01/2015 1253   HDL 44 (L) 12/01/2015 1253   CHOLHDL 5.0  12/01/2015 1253   VLDL 26 12/01/2015 1253   LDLCALC 152 (H) 12/01/2015 1253    No results found for: LIPOA   WEIGHTS: Wt Readings from Last 3 Encounters:  07/16/24 229 lb (103.9 kg)  12/26/23 228 lb (103.4 kg)  11/14/22 237 lb (107.5 kg)    VITALS: BP 126/80 (BP Location: Right Arm, Patient Position: Sitting, Cuff Size: Large)   Pulse 80   Ht 5' 5 (1.651 m)   Wt 229 lb (103.9 kg)   LMP 12/16/2013   SpO2 94%   BMI 38.11 kg/m   EXAM: Deferred  EKG: Deferred  ASSESSMENT: Dyslipidemia, goal LDL less than 70 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   Ms. Katelyn [name] has a dyslipidemia with a target LDL less than 70.  There is a probable familial hyperlipidemia with LDL greater than 190 and multiple family members with cardiovascular disease.  Unfortunately she has been statin intolerant.  She has been on ezetimibe and is trying some dietary and lifestyle modifications but her LDL remains very high.  She is a good candidate for PCSK9 inhibitor however has some concerns about possible side effects.  She also wants to further understand her risk.  To further define this I advise adding an LP(a) and repeating a lipid NMR.  We also discussed genetic testing today.  I think this could be beneficial particularly for cascade screening and therapy.  She is interested and I suspect that it should be covered by insurance but understands if not it could be up to $299 charge.  This generally takes about a few weeks to get back and I will reach out to her with those results.  At that time we could discuss her lab work and see if she is interested in any additional therapies. Finally, she mentioned that her PCP had discussed with her about possible sleep apnea.  She has had fatigue, witnessed snoring, has moderate  obesity and risk factors for obstructive apnea.  A sleep study apparently was not ordered.  She is interested in that I will arrange it for her today.  Her stop-bang score is 4.  Thanks again for the kind referral.  Katelyn KYM Maxcy, MD, [facility], FNLA, FACP  [place]  [facility]  Medical Director of the [facility]  [facility] Diplomate of the American Board of Clinical Lipidology Attending Cardiologist  Direct Dial: [phone]  Fax: [phone]  Website:  [email or web address] Katelyn BROCKS Lorae Roig 07/17/2024, 11:41 AM

## 2024-07-18 ENCOUNTER — Ambulatory Visit: Payer: Self-pay | Admitting: Internal Medicine

## 2024-07-18 LAB — LIPOPROTEIN A (LPA): Lipoprotein (a): 18.8 nmol/L (ref <75.0)

## 2024-07-30 ENCOUNTER — Other Ambulatory Visit: Payer: Self-pay | Admitting: Nurse Practitioner

## 2024-07-30 DIAGNOSIS — Z79890 Hormone replacement therapy (postmenopausal): Secondary | ICD-10-CM

## 2024-07-31 NOTE — Telephone Encounter (Signed)
 Med refill request: CLIMARA   Last AEX: 12/26/23  Next AEX: 12/30/24  Last MMG (if hormonal med) 2023 per pt. 04/01/18 birads cat 1 neg  Refill authorized: last rx 02/11/24 12 with 1 refill. Please Advise?

## 2024-08-04 ENCOUNTER — Encounter: Payer: Self-pay | Admitting: Nurse Practitioner

## 2024-08-04 NOTE — Telephone Encounter (Signed)
 Katelyn [name] -patient will be due for AEX 11/2024. Do you recommend an OV prior to her leaving for additional refills?

## 2024-08-05 NOTE — Telephone Encounter (Signed)
 Yes, because she did not follow up after starting HRT as recommended.

## 2024-08-05 NOTE — Telephone Encounter (Signed)
 Call placed to patient, left detailed message advising she would need OV prior to leaving for additional refills. Return call to office at [phone], opt 4 to further discuss, opt 1 to schedule.

## 2024-08-13 NOTE — Progress Notes (Unsigned)
° °  Acute Office Visit  Subjective:    Patient ID: Katelyn [name], female    DOB: November 01, 1975, 48 y.o.   MRN: [id number]   HPI 48 y.o. presents today for medication management. [place] be [place] of [place] 1 [place] and would like refills. Started HRT April 2025 for hot flashes, sleep disturbance, vaginal dryness, hair thinning, fatigue, brain fog and low libido. Good management. Complains of anal itching x 4 weeks. Using essential oil that helps some for short periods. Itching can be intense. No bleeding. Has diabetes and has not been eating well.   Patient's last menstrual period was 12/16/2013.    Review of Systems  Constitutional: Negative.   Gastrointestinal:  Negative for anal bleeding and rectal pain.       Anal itching       Objective:    Physical Exam Constitutional:      Appearance: Normal appearance.  Genitourinary:     BP 124/82 (BP Location: Left Arm, Patient Position: Sitting, Cuff Size: Normal)   Pulse 67   Ht 5' 5 (1.651 m)   Wt 231 lb (104.8 kg)   LMP 12/16/2013   SpO2 97%   BMI 38.44 kg/m  Wt Readings from Last 3 Encounters:  08/14/24 231 lb (104.8 kg)  07/16/24 229 lb (103.9 kg)  12/26/23 228 lb (103.4 kg)        Patient informed chaperone available to be present for breast and pelvic exam. Patient has requested no chaperone to be present. Patient has been advised what will be completed during breast and pelvic exam.    Assessment & Plan:   Problem List Items Addressed This Visit   None Visit Diagnoses       Postmenopausal hormone therapy    -  Primary   Relevant Medications   estradiol  (CLIMARA  - DOSED IN MG/24 HR) 0.025 mg/24hr patch   progesterone  (PROMETRIUM ) 100 MG capsule     Anal itching       Relevant Medications   fluconazole  (DIFLUCAN ) 150 MG tablet      Plan: Refills for HRT provided. Aware will have to transfer prescriptions as she is traveling this year and some countries have different prescriber regulations. Will  provide Diflucan  for possible fungal infection. If no improvement, then will try preparation-H.   Return in about 1 year (around 08/14/2025) for Annual.    Katelyn DELENA Shutter DNP, 9:34 AM 08/14/2024

## 2024-08-14 ENCOUNTER — Encounter: Payer: Self-pay | Admitting: Nurse Practitioner

## 2024-08-14 ENCOUNTER — Ambulatory Visit: Admitting: Nurse Practitioner

## 2024-08-14 VITALS — BP 124/82 | HR 67 | Ht 65.0 in | Wt 231.0 lb

## 2024-08-14 DIAGNOSIS — Z79890 Hormone replacement therapy (postmenopausal): Secondary | ICD-10-CM | POA: Diagnosis not present

## 2024-08-14 DIAGNOSIS — L290 Pruritus ani: Secondary | ICD-10-CM | POA: Diagnosis not present

## 2024-08-14 MED ORDER — PROGESTERONE MICRONIZED 100 MG PO CAPS: 100.0000 mg | ORAL_CAPSULE | Freq: Every day | ORAL | 4 refills | Status: AC

## 2024-08-14 MED ORDER — ESTRADIOL 0.025 MG/24HR TD PTWK: 0.0250 mg | MEDICATED_PATCH | TRANSDERMAL | 4 refills | Status: AC

## 2024-08-14 MED ORDER — FLUCONAZOLE 150 MG PO TABS: 150.0000 mg | ORAL_TABLET | ORAL | 0 refills | Status: AC

## 2024-08-25 ENCOUNTER — Encounter (HOSPITAL_BASED_OUTPATIENT_CLINIC_OR_DEPARTMENT_OTHER): Payer: Self-pay | Admitting: Internal Medicine

## 2024-08-25 DIAGNOSIS — E7849 Other hyperlipidemia: Secondary | ICD-10-CM

## 2024-08-26 ENCOUNTER — Encounter: Payer: Self-pay | Admitting: Internal Medicine

## 2024-08-29 ENCOUNTER — Other Ambulatory Visit (HOSPITAL_COMMUNITY): Payer: Self-pay

## 2024-08-29 ENCOUNTER — Telehealth: Payer: Self-pay | Admitting: Pharmacy Technician

## 2024-08-29 NOTE — Telephone Encounter (Signed)
 Pharmacy Patient Advocate Encounter   Received notification from Physician's Office - jenna E. that prior authorization for repatha is required/requested.   Insurance verification completed.   The patient is insured through [facility] [employer]/RX [employer].   Per test claim: PA required; PA submitted to above mentioned insurance via Latent Key/confirmation #/EOC [id number] Status is pending

## 2024-08-29 NOTE — Telephone Encounter (Signed)
 Pharmacy Patient Advocate Encounter  Received notification from [employer] that Prior Authorization for repatha has been APPROVED from 08/29/24 to 08/29/25. Ran test claim, Copay is $75.00- one month. This test claim was processed through [facility]- copay amounts may vary at other pharmacies due to pharmacy/plan contracts, or as the patient moves through the different stages of their insurance plan.   PA #/Case ID/Reference #: [id number]

## 2024-12-30 ENCOUNTER — Ambulatory Visit: Admitting: Nurse Practitioner
